# Patient Record
Sex: Female | Born: 1990 | Race: White | Hispanic: No | Smoking: Never smoker
Health system: Southern US, Community
[De-identification: ages and names within clinical notes are randomized; demographics above are authoritative.]

## PROBLEM LIST (undated history)

## (undated) ENCOUNTER — Inpatient Hospital Stay: Payer: Self-pay

## (undated) DIAGNOSIS — I10 Essential (primary) hypertension: Secondary | ICD-10-CM

## (undated) DIAGNOSIS — O139 Gestational [pregnancy-induced] hypertension without significant proteinuria, unspecified trimester: Secondary | ICD-10-CM

## (undated) DIAGNOSIS — F329 Major depressive disorder, single episode, unspecified: Secondary | ICD-10-CM

## (undated) DIAGNOSIS — F32A Depression, unspecified: Secondary | ICD-10-CM

## (undated) DIAGNOSIS — D649 Anemia, unspecified: Secondary | ICD-10-CM

## (undated) HISTORY — DX: Depression, unspecified: F32.A

## (undated) HISTORY — PX: TONSILLECTOMY: SUR1361

## (undated) HISTORY — DX: Anemia, unspecified: D64.9

## (undated) HISTORY — DX: Major depressive disorder, single episode, unspecified: F32.9

---
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
## 2008-12-14 ENCOUNTER — Ambulatory Visit: Payer: Self-pay | Admitting: Otolaryngology

## 2009-08-10 ENCOUNTER — Ambulatory Visit: Payer: Self-pay | Admitting: Oncology

## 2009-08-23 ENCOUNTER — Other Ambulatory Visit: Admission: RE | Admit: 2009-08-23 | Discharge: 2009-08-23 | Payer: Self-pay | Admitting: Oncology

## 2009-08-23 LAB — MORPHOLOGY - CHCC SATELLITE: PLT EST ~~LOC~~: INCREASED

## 2009-08-23 LAB — CBC WITH DIFFERENTIAL (CANCER CENTER ONLY)
BASO#: 0.2 10*3/uL (ref 0.0–0.2)
EOS%: 4.6 % (ref 0.0–7.0)
Eosinophils Absolute: 0.8 10*3/uL — ABNORMAL HIGH (ref 0.0–0.5)
HCT: 35.8 % (ref 34.8–46.6)
HGB: 12.1 g/dL (ref 11.6–15.9)
MCH: 27 pg (ref 26.0–34.0)
MCHC: 33.7 g/dL (ref 32.0–36.0)
MCV: 80 fL — ABNORMAL LOW (ref 81–101)
MONO%: 5.6 % (ref 0.0–13.0)
NEUT#: 10.1 10*3/uL — ABNORMAL HIGH (ref 1.5–6.5)
NEUT%: 59.9 % (ref 39.6–80.0)
RBC: 4.46 10*6/uL (ref 3.70–5.32)

## 2009-08-23 LAB — CMP (CANCER CENTER ONLY)
AST: 13 U/L (ref 11–38)
Albumin: 3.5 g/dL (ref 3.3–5.5)
Alkaline Phosphatase: 65 U/L (ref 26–84)
BUN, Bld: 10 mg/dL (ref 7–22)
Calcium: 9.1 mg/dL (ref 8.0–10.3)
Creat: 0.8 mg/dl (ref 0.6–1.2)
Glucose, Bld: 96 mg/dL (ref 73–118)
Potassium: 4.3 mEq/L (ref 3.3–4.7)

## 2009-08-31 LAB — JAK2 GENOTYPR: JAK2 GenotypR: NOT DETECTED

## 2009-08-31 LAB — IRON AND TIBC
%SAT: 10 % — ABNORMAL LOW (ref 20–55)
TIBC: 383 ug/dL (ref 250–470)
UIBC: 346 ug/dL

## 2009-08-31 LAB — FERRITIN: Ferritin: 27 ng/mL (ref 10–291)

## 2009-08-31 LAB — RETICULOCYTES (CHCC): ABS Retic: 60.2 10*3/uL (ref 19.0–186.0)

## 2009-09-15 ENCOUNTER — Ambulatory Visit: Payer: Self-pay | Admitting: Oncology

## 2009-09-22 LAB — CBC WITH DIFFERENTIAL (CANCER CENTER ONLY)
BASO#: 0.2 10*3/uL (ref 0.0–0.2)
Eosinophils Absolute: 0.8 10*3/uL — ABNORMAL HIGH (ref 0.0–0.5)
HCT: 36.5 % (ref 34.8–46.6)
HGB: 12.2 g/dL (ref 11.6–15.9)
LYMPH#: 5.4 10*3/uL — ABNORMAL HIGH (ref 0.9–3.3)
MCHC: 33.5 g/dL (ref 32.0–36.0)
MONO#: 0.8 10*3/uL (ref 0.1–0.9)
NEUT#: 9.5 10*3/uL — ABNORMAL HIGH (ref 1.5–6.5)
NEUT%: 57.2 % (ref 39.6–80.0)
RBC: 4.64 10*6/uL (ref 3.70–5.32)
WBC: 16.7 10*3/uL — ABNORMAL HIGH (ref 3.9–10.0)

## 2011-12-31 ENCOUNTER — Observation Stay: Payer: Self-pay

## 2011-12-31 LAB — PIH PROFILE
Anion Gap: 8 (ref 7–16)
BUN: 7 mg/dL (ref 7–18)
Chloride: 109 mmol/L — ABNORMAL HIGH (ref 98–107)
EGFR (African American): >60
EGFR (Non-African Amer.): >60
Glucose: 122 mg/dL — ABNORMAL HIGH (ref 65–99)
HGB: 10.4 g/dL — ABNORMAL LOW (ref 12.0–16.0)
Osmolality: 275 (ref 275–301)
Platelet: 338 10*3/uL (ref 150–440)
RBC: 4.17 10*6/uL (ref 3.80–5.20)
SGOT(AST): 11 U/L — ABNORMAL LOW (ref 15–37)
Uric Acid: 5 mg/dL (ref 2.6–6.0)
WBC: 15.4 10*3/uL — ABNORMAL HIGH (ref 3.6–11.0)

## 2012-01-01 LAB — CREATININE, URINE, 24 HOUR
Collection Hours: 24 hours
Creatinine, 24 Hr Urine: 1045 mg/24hr — ABNORMAL HIGH (ref 600–1000)

## 2012-01-01 LAB — PROTEIN, URINE, 24 HOUR
Collection Hours: 24 hours
Protein, Urine: 16 mg/dL (ref 0–12)
Total Volume: 1300 mL

## 2012-01-14 ENCOUNTER — Inpatient Hospital Stay: Payer: Self-pay | Admitting: Obstetrics and Gynecology

## 2012-01-14 LAB — PIH PROFILE
Calcium, Total: 8.7 mg/dL (ref 8.5–10.1)
Chloride: 108 mmol/L — ABNORMAL HIGH (ref 98–107)
Co2: 23 mmol/L (ref 21–32)
Creatinine: 0.51 mg/dL — ABNORMAL LOW (ref 0.60–1.30)
EGFR (African American): >60
Glucose: 86 mg/dL (ref 65–99)
MCHC: 31.9 g/dL — ABNORMAL LOW (ref 32.0–36.0)
Platelet: 362 10*3/uL (ref 150–440)
Potassium: 4.2 mmol/L (ref 3.5–5.1)
RDW: 16.1 % — ABNORMAL HIGH (ref 11.5–14.5)
SGOT(AST): 9 U/L — ABNORMAL LOW (ref 15–37)
Sodium: 137 mmol/L (ref 136–145)
Uric Acid: 5 mg/dL (ref 2.6–6.0)
WBC: 16.9 10*3/uL — ABNORMAL HIGH (ref 3.6–11.0)

## 2012-01-14 LAB — PROTEIN / CREATININE RATIO, URINE
Creatinine, Urine: 122.3 mg/dL (ref 30.0–125.0)
Protein, Random Urine: 43 mg/dL — ABNORMAL HIGH (ref 0–12)

## 2012-01-15 LAB — CBC WITH DIFFERENTIAL/PLATELET
Basophil #: 0.1 10*3/uL (ref 0.0–0.1)
Eosinophil %: 0.5 %
HGB: 10.7 g/dL — ABNORMAL LOW (ref 12.0–16.0)
Lymphocyte #: 2.9 10*3/uL (ref 1.0–3.6)
Lymphocyte %: 16.2 %
MCH: 24.7 pg — ABNORMAL LOW (ref 26.0–34.0)
MCV: 79 fL — ABNORMAL LOW (ref 80–100)
Monocyte #: 0.8 x10 3/mm (ref 0.2–0.9)
Monocyte %: 4.6 %
Neutrophil %: 78.2 %
Platelet: 376 10*3/uL (ref 150–440)
RDW: 16.2 % — ABNORMAL HIGH (ref 11.5–14.5)
WBC: 17.8 10*3/uL — ABNORMAL HIGH (ref 3.6–11.0)

## 2012-01-15 LAB — PROTEIN, URINE, 24 HOUR
Protein, 24 Hour Urine: 360 mg/24HR — ABNORMAL HIGH (ref 30–149)
Protein, Urine: 12 mg/dL (ref 0–12)

## 2012-01-16 DIAGNOSIS — O1490 Unspecified pre-eclampsia, unspecified trimester: Secondary | ICD-10-CM

## 2012-01-17 LAB — HEMATOCRIT: HCT: 30.2 % — ABNORMAL LOW (ref 35.0–47.0)

## 2014-06-14 NOTE — H&P (Signed)
L&D Evaluation:  History:   HPI 24 year old G1P0 presents to L&D at 35 weeks 3 days from [facility]. She had a few elevated BP's in the office (130-140/80's) and had increased swelling. EDD 02/01/12, [facility] not notable for any significant events so far.    Presents with PIH eval    Patient's Medical History No Chronic Illness    Patient's Surgical History none    Medications Pre Natal Vitamins    Allergies Codeine    Social History none    Family History Non-Contributory   ROS:   ROS see HPI   Exam:   Vital Signs mainly 130-140/70-80's    Urine Protein negative dipstick, P/C ratio 255    General no apparent distress    Mental Status clear    Chest clear    Abdomen gravid, non-tender    Back no CVAT    Edema 1+    Reflexes 2+    Clonus negative    Mebranes Intact    FHT normal rate with no decels    Ucx irritability noted    Skin dry   Impression:   Impression evaluation for PIH, reactive NST, 35 weeks 3 days, PIH   Plan:   Plan EFM/NST, PIH panel, discharge    Comments PIH labs WNL P/C ratio 255 Will dc to home to complete 24 hour urine, pt to drop off tomorrow to hosp Has appt next Tues at office Note given for pt to be out of work the next few days, will return to work on Dec 2 (Monday)    Follow Up Appointment already scheduled. in 1 week   Electronic Signatures: Shella Maximutnam, Chaniah Cisse (CNM)  (Signed [id number]-Nov-13 12:03)  Authored: L&D Evaluation   Last Updated: 26-Nov-13 12:03 by Shella MaximPutnam, Castella Lerner (CNM)

## 2014-06-14 NOTE — H&P (Signed)
L&D Evaluation:  History Expanded:   HPI 24 yo G1P0 at 6637 3/7 weeks w h/o Pregnancy Induced Hypertension, still working and noted increased BP today in office.  Mild headache today relieved w Tylenol.  Denies blurry vision, epigastric pain, worsening edema, SOB, CP, or symptoms of labor.   BP in office was 150/100.  Prior labs and eval for Pregnancy Induced Hypertension was 11/26-27, with 24 hour urine protein 200.   [facility], compl by obesity.    Gravida 1    Term 0    Patient's Medical History Obesity    Patient's Surgical History none    Medications Pre Natal Vitamins    Allergies NKDA    Social History none    Family History Non-Contributory   ROS:   ROS All systems were reviewed.  HEENT, CNS, GI, GU, Respiratory, CV, Renal and Musculoskeletal systems were found to be normal.   Exam:   Vital Signs BP 153/105 initially, then 120/60s mostly, P 80.    Urine Protein trace    General no apparent distress    Mental Status clear    Chest clear    Heart normal sinus rhythm, no murmur/gallop/rubs    Abdomen gravid, non-tender    Estimated Fetal Weight Average for gestational age    Back no CVAT    Edema 1+    Reflexes 1+    Pelvic (2/80 per office exam Dr Bonney AidStaebler)    FHT normal rate with no decels    Ucx absent    Skin dry    Other See Pregnancy Induced Hypertension labs   Impression:   Impression evaluation for PIH   Plan:   Plan EFM/NST    Comments 24 hour urine for protein.  Option sof management at 37 3/7 weeks discussed, with preference for exp management and rest as this seems to help w BP control.  No more work.  Prefer to wait til 39 weeks.  If urine results elevated more, option of Induction of labor reasonable. Patient is counseled at length about the risks and consequences of Pregnancy Induced Hypertension, especially as it pertains to the development of preclampsia.  Risks of preclampsia include maternal and fetal  complications, usually necessitating active delivery planning.  Physical exam findings, fetal heart rate monitoring, and laboratory findings are utilized together to determine the presence and severity of any gestational hypertension disorder.  Further management will be based on these findings.  Continued bed rest and frequent follow-up for blood pressure checks and symptom evaluation is of utmost importance for the remainder of pregnancy.   Electronic Signatures: Letitia LibraHarris, Pippa Hanif Paul (MD)  (Signed 10-Dec-13 14:22)  Authored: L&D Evaluation   Last Updated: 10-Dec-13 14:22 by Letitia LibraHarris, Jackqueline Aquilar Paul (MD)

## 2014-08-11 LAB — OB RESULTS CONSOLE RUBELLA ANTIBODY, IGM: RUBELLA: NON-IMMUNE/NOT IMMUNE

## 2014-08-11 LAB — OB RESULTS CONSOLE GC/CHLAMYDIA
CHLAMYDIA, DNA PROBE: NEGATIVE
Gonorrhea: NEGATIVE

## 2015-01-16 LAB — OB RESULTS CONSOLE VARICELLA ZOSTER ANTIBODY, IGG: Varicella: IMMUNE

## 2015-02-05 NOTE — L&D Delivery Note (Signed)
VAGINAL DELIVERY NOTE:  Date of Delivery: 04/05/2015 Primary OB: [facility]  Gestational Age/EDD: [redacted]w[redacted]d 04/10/2015, by Other Basis Antepartum complications: Obesity, Hypertension Attending Physician: Annamarie Major, MD, FACOG Delivery Type: spontaneous vaginal delivery  Anesthesia: epidural Laceration: none Episiotomy: none Placenta: spontaneous Intrapartum complications: None Estimated Blood Loss: 100 mL GBS: Pos  Baby: Liveborn female, Apgars 8/9, weight 7 #, 9 oz

## 2015-02-21 ENCOUNTER — Observation Stay
Admission: EM | Admit: 2015-02-21 | Discharge: 2015-02-21 | Disposition: A | Discharge status: Home / Self Care | Payer: Medicaid Other | Attending: Obstetrics & Gynecology | Admitting: Obstetrics & Gynecology

## 2015-02-21 ENCOUNTER — Encounter: Payer: Self-pay | Admitting: *Deleted

## 2015-02-21 ENCOUNTER — Observation Stay: Payer: Medicaid Other

## 2015-02-21 DIAGNOSIS — O212 Late vomiting of pregnancy: Secondary | ICD-10-CM | POA: Diagnosis not present

## 2015-02-21 DIAGNOSIS — O2303 Infections of kidney in pregnancy, third trimester: Secondary | ICD-10-CM

## 2015-02-21 DIAGNOSIS — O219 Vomiting of pregnancy, unspecified: Secondary | ICD-10-CM | POA: Diagnosis present

## 2015-02-21 DIAGNOSIS — E860 Dehydration: Secondary | ICD-10-CM | POA: Insufficient documentation

## 2015-02-21 DIAGNOSIS — Z885 Allergy status to narcotic agent status: Secondary | ICD-10-CM | POA: Insufficient documentation

## 2015-02-21 DIAGNOSIS — O479 False labor, unspecified: Secondary | ICD-10-CM | POA: Diagnosis present

## 2015-02-21 DIAGNOSIS — O2343 Unspecified infection of urinary tract in pregnancy, third trimester: Secondary | ICD-10-CM | POA: Diagnosis not present

## 2015-02-21 DIAGNOSIS — Z3A33 33 weeks gestation of pregnancy: Secondary | ICD-10-CM | POA: Diagnosis not present

## 2015-02-21 LAB — URINALYSIS COMPLETE WITH MICROSCOPIC (ARMC ONLY)
BILIRUBIN URINE: NEGATIVE
GLUCOSE, UA: NEGATIVE mg/dL
Hgb urine dipstick: NEGATIVE
Ketones, ur: NEGATIVE mg/dL
NITRITE: NEGATIVE
Protein, ur: 30 mg/dL — AB
SPECIFIC GRAVITY, URINE: 1.016 (ref 1.005–1.030)
TRANS EPITHEL UA: 1
pH: 6 (ref 5.0–8.0)

## 2015-02-21 LAB — COMPREHENSIVE METABOLIC PANEL
ALK PHOS: 108 U/L (ref 38–126)
ALT: 13 U/L — AB (ref 14–54)
AST: 15 U/L (ref 15–41)
Albumin: 2.6 g/dL — ABNORMAL LOW (ref 3.5–5.0)
Anion gap: 10 (ref 5–15)
BUN: 7 mg/dL (ref 6–20)
CALCIUM: 8.7 mg/dL — AB (ref 8.9–10.3)
CHLORIDE: 105 mmol/L (ref 101–111)
CO2: 22 mmol/L (ref 22–32)
CREATININE: 0.7 mg/dL (ref 0.44–1.00)
GFR calc Af Amer: >60 mL/min (ref >60)
GFR calc non Af Amer: >60 mL/min (ref >60)
Glucose, Bld: 99 mg/dL (ref 65–99)
Potassium: 3.6 mmol/L (ref 3.5–5.1)
SODIUM: 137 mmol/L (ref 135–145)
Total Bilirubin: 0.3 mg/dL (ref 0.3–1.2)
Total Protein: 6.4 g/dL — ABNORMAL LOW (ref 6.5–8.1)

## 2015-02-21 LAB — CBC
HCT: 29.9 % — ABNORMAL LOW (ref 35.0–47.0)
HEMOGLOBIN: 9.6 g/dL — AB (ref 12.0–16.0)
MCH: 25 pg — ABNORMAL LOW (ref 26.0–34.0)
MCHC: 32.3 g/dL (ref 32.0–36.0)
MCV: 77.6 fL — AB (ref 80.0–100.0)
PLATELETS: 325 10*3/uL (ref 150–440)
RBC: 3.85 MIL/uL (ref 3.80–5.20)
RDW: 15.6 % — ABNORMAL HIGH (ref 11.5–14.5)
WBC: 21.6 10*3/uL — AB (ref 3.6–11.0)

## 2015-02-21 LAB — PROTEIN / CREATININE RATIO, URINE
CREATININE, URINE: 360 mg/dL
Protein Creatinine Ratio: 0.14 mg/mg{Cre} (ref 0.00–0.15)
TOTAL PROTEIN, URINE: 52 mg/dL

## 2015-02-21 MED ORDER — LACTATED RINGERS IV SOLN
INTRAVENOUS | Status: DC
  Administered 2015-02-21 (×2): via INTRAVENOUS

## 2015-02-21 MED ORDER — MORPHINE SULFATE (PF) 2 MG/ML IV SOLN
1.0000 mg | INTRAVENOUS | Status: DC | PRN
  Administered 2015-02-21: 1 mg via INTRAVENOUS
  Filled 2015-02-21: qty 1

## 2015-02-21 MED ORDER — ACETAMINOPHEN 325 MG PO TABS: 650.0000 mg | ORAL_TABLET | ORAL | Status: DC | PRN

## 2015-02-21 MED ORDER — OXYCODONE-ACETAMINOPHEN 5-325 MG PO TABS
1.0000 | ORAL_TABLET | ORAL | Status: DC | PRN
  Administered 2015-02-21: 1 via ORAL
  Filled 2015-02-21: qty 2

## 2015-02-21 MED ORDER — DEXTROSE 5 % IV SOLN
1.0000 g | INTRAVENOUS | Status: DC
  Administered 2015-02-21: 1 g via INTRAVENOUS
  Filled 2015-02-21: qty 10

## 2015-02-21 MED ORDER — ONDANSETRON HCL 4 MG/2ML IJ SOLN
4.0000 mg | Freq: Four times a day (QID) | INTRAMUSCULAR | Status: DC | PRN
  Administered 2015-02-21: 4 mg via INTRAVENOUS
  Filled 2015-02-21: qty 2

## 2015-02-21 NOTE — Progress Notes (Signed)
[place] c/w milf left hydro and 6mm non-obstructive kidney stone. Pt still has pain. Will treat w one dose of Morphine now.  Rec treat infection and monitor for worsening pain sx's that could be related to stone.  May be incidental finding. Fluids, ABX, analgesics. F/U this week.

## 2015-02-21 NOTE — Progress Notes (Signed)
Benign Gynecology Progress Note  Admission Date: 02/21/2015 Current Date: 02/21/2015  Cheryl Pope is a 25 y.o. G2P0  @ [redacted]w[redacted]d with left side and flank pain, also abd pain, assoc w n/v/d See UA- bllod and leuk and WBC and protein WBC elevated.   History complicated by: Patient Active Problem List   Diagnosis Date Noted  . Nausea and vomiting of pregnancy, antepartum 02/21/2015  . Irregular contractions 02/21/2015   ROS and patient/family/surgical history, located on admission H&P note dated 02/21/2015, have been reviewed, and there are no changes except as noted below  Subjective:  Still has sporadic pains.   Objective:   Filed Vitals:   02/21/15 1350 02/21/15 1508  BP: 137/73 134/63  Pulse: 98 79  Temp: 97.7 F (36.5 C)   TempSrc: Oral   Resp: 18    Temp:  [97.7 F (36.5 C)] 97.7 F (36.5 C) (01/17 1350) Pulse Rate:  [79-98] 79 (01/17 1508) Resp:  [18] 18 (01/17 1350) BP: (134-137)/(63-73) 134/63 mmHg (01/17 1508)     No intake or output data in the 24 hours ending 02/21/15 1633   Current Vital Signs 24h Vital Sign Ranges  T 97.7 F (36.5 C) Temp  Avg: 97.7 F (36.5 C)  Min: 97.7 F (36.5 C)  Max: 97.7 F (36.5 C)  BP 134/63 mmHg BP  Min: 134/63  Max: 137/73  HR 79 Pulse  Avg: 88.5  Min: 79  Max: 98  RR 18 Resp  Avg: 18  Min: 18  Max: 18  SaO2     No Data Recorded           24 Hour I/O Current Shift I/O  Time Ins Outs        Physical exam: General appearance: alert, cooperative and mild distress Abdomen: gravid NT uterus, tender on L side and flank. GU: No gross VB Back: No CVAT Extremities: no redness or tenderness in the calves or thighs, no edema Skin: no lesions SVE- Cl/Th/High Psych: appropriate  Labs:    Recent Labs Lab 02/21/15 1452  NA 137  K 3.6  CL 105  CO2 22  BUN 7  CREATININE 0.70  GLUCOSE 99    Recent Labs Lab 02/21/15 1452  WBC 21.6*  HGB 9.6*  HCT 29.9*  PLT 325     Recent Labs Lab 02/21/15 1452   CALCIUM 8.7*   No results for input(s): INR, APTT in the last 168 hours.     Recent Labs Lab 02/21/15 1452  ALKPHOS 108  BILITOT 0.3  PROT 6.4*  ALT 13*  AST 15     Recent Labs Lab 02/21/15 1452  WBC 21.6*  HGB 9.6*  HCT 29.9*  PLT 325    Recent Labs Lab 02/21/15 1452  NA 137  K 3.6  CL 105  CO2 22  BUN 7  CREATININE 0.70  CALCIUM 8.7*  PROT 6.4*  BILITOT 0.3  ALKPHOS 108  ALT 13*  AST 15  GLUCOSE 99    Assessment & Plan:  Pregnant, 33 weeks, Abdominal Pain, N/V/D, UTI by UA. Possible Gastroenteritis, also possible Pylonephritis  1. IVF 2. Ceftriaxone for pyelo 3. Renal US to evaluate for hydro/abscess/stone 4. Treat GI sx's conservatively 5. FWR.  Periodic FHR monitoring.  No s/sx PTL.

## 2015-02-21 NOTE — Discharge Instructions (Signed)
Pyelonephritis, Adult Pyelonephritis is a kidney infection. The kidneys are the organs that filter a person's blood and move waste out of the bloodstream and into the urine. Urine passes from the kidneys, through the ureters, and into the bladder. There are two main types of pyelonephritis:  Infections that come on quickly without any warning (acute pyelonephritis).  Infections that last for a long period of time (chronic pyelonephritis). In most cases, the infection clears up with treatment and does not cause further problems. More severe infections or chronic infections can sometimes spread to the bloodstream or lead to other problems with the kidneys. CAUSES This condition is usually caused by:  Bacteria traveling from the bladder to the kidney through infected urine. The urine in the bladder can become infected with bacteria from:  Bladder infection (cystitis).  Inflammation of the prostate gland (prostatitis).  Sexual intercourse, in females.  Bacteria traveling from the bloodstream to the kidney. RISK FACTORS This condition is more likely to develop in:  Pregnant women.  Older people.  People who have diabetes.  People who have kidney stones or bladder stones.  People who have other abnormalities of the kidney or ureter.  People who have a catheter placed in the bladder.  People who have cancer.  People who are sexually active.  Women who use spermicides.  People who have had a prior urinary tract infection. SYMPTOMS Symptoms of this condition include:  Frequent urination.  Strong or persistent urge to urinate.  Burning or stinging when urinating.  Abdominal pain.  Back pain.  Pain in the side or flank area.  Fever.  Chills.  Blood in the urine, or dark urine.  Nausea.  Vomiting. DIAGNOSIS This condition may be diagnosed based on:  Medical history and physical exam.  Urine tests.  Blood tests. You may also have imaging tests of the  kidneys, such as an ultrasound or CT scan. TREATMENT Treatment for this condition may depend on the severity of the infection.  If the infection is mild and is found early, you may be treated with antibiotic medicines taken by mouth. You will need to drink fluids to remain hydrated.  If the infection is more severe, you may need to stay in the hospital and receive antibiotics given directly into a vein through an IV tube. You may also need to receive fluids through an IV tube if you are not able to remain hydrated. After your hospital stay, you may need to take oral antibiotics for a period of time. Other treatments may be required, depending on the cause of the infection. HOME CARE INSTRUCTIONS Medicines  Take over-the-counter and prescription medicines only as told by your health care provider.  If you were prescribed an antibiotic medicine, take it as told by your health care provider. Do not stop taking the antibiotic even if you start to feel better. General Instructions  Drink enough fluid to keep your urine clear or pale yellow.  Avoid caffeine, tea, and carbonated beverages. They tend to irritate the bladder.  Urinate often. Avoid holding in urine for long periods of time.  Urinate before and after sex.  After a bowel movement, women should cleanse from front to back. Use each tissue only once.  Keep all follow-up visits as told by your health care provider. This is important. SEEK MEDICAL CARE IF:  Your symptoms do not get better after 2 days of treatment.  Your symptoms get worse.  You have a fever. SEEK IMMEDIATE MEDICAL CARE IF:  You  are unable to take your antibiotics or fluids.  You have shaking chills.  You vomit.  You have severe flank or back pain.  You have extreme weakness or fainting.   This information is not intended to replace advice given to you by your health care provider. Make sure you discuss any questions you have with your health care  provider.   Document Released: 01/21/2005 Document Revised: 10/12/2014 Document Reviewed: 05/16/2014 Elsevier Interactive Patient Education 2016 Else[employer] Inc[employer]  Pyelonephritis, Adult Pyelonephritis is a kidney infection. The kidneys are the organs that filter a person's blood and move waste out of the bloodstream and into the urine. Urine passes from the kidneys, through the ureters, and into the bladder. There are two main types of pyelonephritis:  Infections that come on quickly without any warning (acute pyelonephritis).  Infections that last for a long period of time (chronic pyelonephritis). In most cases, the infection clears up with treatment and does not cause further problems. More severe infections or chronic infections can sometimes spread to the bloodstream or lead to other problems with the kidneys. CAUSES This condition is usually caused by:  Bacteria traveling from the bladder to the kidney through infected urine. The urine in the bladder can become infected with bacteria from:  Bladder infection (cystitis).  Inflammation of the prostate gland (prostatitis).  Sexual intercourse, in females.  Bacteria traveling from the bloodstream to the kidney. RISK FACTORS This condition is more likely to develop in:  Pregnant women.  Older people.  People who have diabetes.  People who have kidney stones or bladder stones.  People who have other abnormalities of the kidney or ureter.  People who have a catheter placed in the bladder.  People who have cancer.  People who are sexually active.  Women who use spermicides.  People who have had a prior urinary tract infection. SYMPTOMS Symptoms of this condition include:  Frequent urination.  Strong or persistent urge to urinate.  Burning or stinging when urinating.  Abdominal pain.  Back pain.  Pain in the side or flank area.  Fever.  Chills.  Blood in the urine, or dark  urine.  Nausea.  Vomiting. DIAGNOSIS This condition may be diagnosed based on:  Medical history and physical exam.  Urine tests.  Blood tests. You may also have imaging tests of the kidneys, such as an ultrasound or CT scan. TREATMENT Treatment for this condition may depend on the severity of the infection.  If the infection is mild and is found early, you may be treated with antibiotic medicines taken by mouth. You will need to drink fluids to remain hydrated.  If the infection is more severe, you may need to stay in the hospital and receive antibiotics given directly into a vein through an IV tube. You may also need to receive fluids through an IV tube if you are not able to remain hydrated. After your hospital stay, you may need to take oral antibiotics for a period of time. Other treatments may be required, depending on the cause of the infection. HOME CARE INSTRUCTIONS Medicines  Take over-the-counter and prescription medicines only as told by your health care provider.  If you were prescribed an antibiotic medicine, take it as told by your health care provider. Do not stop taking the antibiotic even if you start to feel better. General Instructions  Drink enough fluid to keep your urine clear or pale yellow.  Avoid caffeine, tea, and carbonated beverages. They tend to irritate the bladder.  Urinate often. Avoid holding in urine for long periods of time.  Urinate before and after sex.  After a bowel movement, women should cleanse from front to back. Use each tissue only once.  Keep all follow-up visits as told by your health care provider. This is important. SEEK MEDICAL CARE IF:  Your symptoms do not get better after 2 days of treatment.  Your symptoms get worse.  You have a fever. SEEK IMMEDIATE MEDICAL CARE IF:  You are unable to take your antibiotics or fluids.  You have shaking chills.  You vomit.  You have severe flank or back pain.  You have  extreme weakness or fainting.   This information is not intended to replace advice given to you by your health care provider. Make sure you discuss any questions you have with your health care provider.   Document Released: 01/21/2005 Document Revised: 10/12/2014 Document Reviewed: 05/16/2014 Elsevier Interactive Patient Education [employer]   Patient will  Pyelonephritis, Adult Pyelonephritis is a kidney infection. The kidneys are organs that help clean your blood by moving waste out of your blood and into your pee (urine). This infection can happen quickly, or it can last for a long time. In most cases, it clears up with treatment and does not cause other problems. HOME CARE Medicines  Take over-the-counter and prescription medicines only as told by your doctor.  Take your antibiotic medicine as told by your doctor. Do not stop taking the medicine even if you start to feel better. General Instructions  Drink enough fluid to keep your pee clear or pale yellow.  Avoid caffeine, tea, and carbonated drinks.  Pee (urinate) often. Avoid holding in pee for long periods of time.  Pee before and after sex.  After pooping (having a bowel movement), women should wipe from front to back. Use each tissue only once.  Keep all follow-up visits as told by your doctor. This is important. GET HELP IF:  You do not feel better after 2 days.  Your symptoms get worse.  You have a fever. GET HELP RIGHT AWAY IF:  You cannot take your medicine or drink fluids as told.  You have chills and shaking.  You throw up (vomit).  You have very bad pain in your side (flank) or back.  You feel very weak or you pass out (faint).   This information is not intended to replace advice given to you by your health care provider. Make sure you discuss any questions you have with your health care provider.   Document Released: 02/29/2004 Document Revised: 10/12/2014 Document Reviewed:  05/16/2014 Elsevier Interactive Patient Education [employer]

## 2015-02-21 NOTE — H&P (Signed)
Obstetrics Admission History & Physical   Abdominal Pain Nausea   HPI:  25 y.o. G2P0 @ [redacted]w[redacted]d (04/10/2015, by Other Basis). Admitted on 02/21/2015:   Patient Active Problem List   Diagnosis Date Noted  . Nausea and vomiting of pregnancy, antepartum 02/21/2015  . Irregular contractions 02/21/2015     Presents for 1 day h/o n/v/d, fatigue, andominal pain.  Dehydration.  P[facility] care at: at [facility]  PMHx:  Past Medical History  Diagnosis Date  . Medical history non-contributory    PSHx:  Past Surgical History  Procedure Laterality Date  . Tonsillectomy     Medications:  Prescriptions prior to admission  Medication Sig Dispense Refill Last Dose  . Prenatal Vit-Fe Fumarate-FA (PRENATAL MULTIVITAMIN) TABS tablet Take 1 tablet by mouth daily at 12 noon.   Unknown at Unknown time   Allergies: is allergic to codeine. OBHx:  OB History  Gravida Para Term Preterm AB SAB TAB Ectopic Multiple Living  2             # Outcome Date GA Lbr Len/2nd Weight Sex Delivery Anes PTL Lv  2 Current           1 Gravida 01/16/12    M Vag-Spont        Complications: Preeclampsia     WUJ:[id number] except as detailed in HPI. Soc Hx: Pregnancy welcomed  Objective:   Filed Vitals:   02/21/15 1350  BP: 137/73  Pulse: 98  Temp: 97.7 F (36.5 C)  Resp: 18   General: Well nourished, well developed female in no acute distress.  Skin: Warm and dry.  Cardiovascular:Regular rate and rhythm. Respiratory: Clear to auscultation bilateral. Normal respiratory effort Abdomen: mild Neuro/Psych: Normal mood and affect.   EFM:FHR: 150 bpm, variability: moderate,  accelerations:  Present,  decelerations:  Absent Toco: None  Assessment & Plan:   25 y.o. G2P0 @ [redacted]w[redacted]d, Admitted on 02/21/2015: nausea and vomiting, diarrhea, associated abd pain---possible GASTROENTERITIS.     Plan to monitor and check labs and provide IVF.  FWR.

## 2015-02-21 NOTE — Final Progress Note (Signed)
Physician Final Progress Note  Patient ID: Cheryl Pope MRN: [id number] DOB/AGE: 08-10-1990 25 y.o.  Admit date: 02/21/2015 Admitting provider: Nadara Mustard, MD Discharge date: 02/21/2015   Admission Diagnoses: Pain  Discharge Diagnoses:  Principal Problem:   Nausea and vomiting of pregnancy, antepartum Active Problems:   Irregular contractions  UTI  Consults: None  Significant Findings/ Diagnostic Studies: labs: UTI and radiology: US kidney w mild left hydro and 6mm stone  Procedures: A NST procedure was performed with FHR monitoring and a normal baseline established, appropriate time of 20-40 minutes of evaluation, and accels >2 seen w 15x15 characteristics.  Results show a REACTIVE NST.   Discharge Condition: good  Disposition: home  Diet: Regular diet  Discharge Activity: Activity as tolerated     Medication List    TAKE these medications        prenatal multivitamin Tabs tablet  Take 1 tablet by mouth daily at 12 noon.           Follow-up Information    Follow up with [name], MD In 2 days.   Specialty:  Obstetrics and Gynecology   Why:  folow up   Contact information:   [address] [place] [address] [phone]       Total time spent taking care of this patient: 30 minutes  Signed: [name] 02/21/2015, 7:27 PM

## 2015-02-21 NOTE — OB Triage Note (Signed)
Patient states she vomited at 1130 and since then has had abd pain that tightens and pressure in pelvis that come bout every 10 min with a sharp stabbing pain in her left lower back, pt does complain of feeling the need to go pee all the time put can't. Patient has vomited once since initial emesis at 1130.denies any LOF, bleeding or any other concerns.

## 2015-02-21 NOTE — Progress Notes (Signed)
Written Rx's given to patient.  Discharge instructions given and  reviewed with patient, denies any further questions.

## 2015-03-10 ENCOUNTER — Encounter: Payer: Self-pay | Admitting: *Deleted

## 2015-03-10 ENCOUNTER — Observation Stay
Admission: EM | Admit: 2015-03-10 | Discharge: 2015-03-10 | Disposition: A | Discharge status: Home / Self Care | Payer: Medicaid Other | Attending: Obstetrics and Gynecology | Admitting: Obstetrics and Gynecology

## 2015-03-10 DIAGNOSIS — O113 Pre-existing hypertension with pre-eclampsia, third trimester: Principal | ICD-10-CM | POA: Insufficient documentation

## 2015-03-10 DIAGNOSIS — O10913 Unspecified pre-existing hypertension complicating pregnancy, third trimester: Secondary | ICD-10-CM | POA: Diagnosis not present

## 2015-03-10 DIAGNOSIS — Z3A35 35 weeks gestation of pregnancy: Secondary | ICD-10-CM | POA: Insufficient documentation

## 2015-03-10 HISTORY — DX: Gestational (pregnancy-induced) hypertension without significant proteinuria, unspecified trimester: O13.9

## 2015-03-10 HISTORY — DX: Essential (primary) hypertension: I10

## 2015-03-10 LAB — COMPREHENSIVE METABOLIC PANEL
ALBUMIN: 2.5 g/dL — AB (ref 3.5–5.0)
ALT: 15 U/L (ref 14–54)
AST: 11 U/L — AB (ref 15–41)
Alkaline Phosphatase: 124 U/L (ref 38–126)
Anion gap: 8 (ref 5–15)
BUN: 7 mg/dL (ref 6–20)
CHLORIDE: 105 mmol/L (ref 101–111)
CO2: 22 mmol/L (ref 22–32)
Calcium: 8.2 mg/dL — ABNORMAL LOW (ref 8.9–10.3)
Creatinine, Ser: 0.57 mg/dL (ref 0.44–1.00)
GFR calc Af Amer: >60 mL/min (ref >60)
GFR calc non Af Amer: >60 mL/min (ref >60)
GLUCOSE: 83 mg/dL (ref 65–99)
POTASSIUM: 3.9 mmol/L (ref 3.5–5.1)
Sodium: 135 mmol/L (ref 135–145)
Total Bilirubin: 0.1 mg/dL — ABNORMAL LOW (ref 0.3–1.2)
Total Protein: 6.2 g/dL — ABNORMAL LOW (ref 6.5–8.1)

## 2015-03-10 LAB — CBC
HEMATOCRIT: 30.3 % — AB (ref 35.0–47.0)
Hemoglobin: 9.8 g/dL — ABNORMAL LOW (ref 12.0–16.0)
MCH: 25 pg — ABNORMAL LOW (ref 26.0–34.0)
MCHC: 32.2 g/dL (ref 32.0–36.0)
MCV: 77.6 fL — ABNORMAL LOW (ref 80.0–100.0)
PLATELETS: 344 10*3/uL (ref 150–440)
RBC: 3.9 MIL/uL (ref 3.80–5.20)
RDW: 15.8 % — AB (ref 11.5–14.5)
WBC: 17.2 10*3/uL — AB (ref 3.6–11.0)

## 2015-03-10 LAB — TYPE AND SCREEN
ABO/RH(D): A POS
ANTIBODY SCREEN: NEGATIVE

## 2015-03-10 LAB — URINE DRUG SCREEN, QUALITATIVE (ARMC ONLY)
Amphetamines, Ur Screen: NOT DETECTED
Barbiturates, Ur Screen: NOT DETECTED
Benzodiazepine, Ur Scrn: NOT DETECTED
CANNABINOID 50 NG, UR ~~LOC~~: NOT DETECTED
COCAINE METABOLITE, UR ~~LOC~~: NOT DETECTED
MDMA (ECSTASY) UR SCREEN: NOT DETECTED
Methadone Scn, Ur: NOT DETECTED
OPIATE, UR SCREEN: NOT DETECTED
PHENCYCLIDINE (PCP) UR S: NOT DETECTED
Tricyclic, Ur Screen: NOT DETECTED

## 2015-03-10 LAB — PROTEIN / CREATININE RATIO, URINE
Creatinine, Urine: 137 mg/dL
Protein Creatinine Ratio: 0.15 mg/mg{Cre} (ref 0.00–0.15)
Total Protein, Urine: 21 mg/dL

## 2015-03-10 LAB — ABO/RH: ABO/RH(D): A POS

## 2015-03-10 NOTE — OB Triage Note (Signed)
Sent from [facility] for pre-eclampsia evaluation.

## 2015-03-10 NOTE — Final Progress Note (Signed)
Physician Final Progress Note  Patient ID: Cheryl Pope MRN: [id number] DOB/AGE: [birth year] 25 y.o.  Admit date: 03/10/2015 Admitting provider: Conard Novak, MD Discharge date: 03/10/2015   Admission Diagnoses: Elevated blood pressure/ Proteinuria, Chronic hypertension, IUP at 35.4 weeks  Discharge Diagnoses:  Chronic hypertension No evidence of preeclampsia  Consults: None  Significant Findings/ Diagnostic Studies: 25 year old G2 P1001 with EDC=04/10/2015 by a 7wk1day ultrasound presented from office 35.4 weeks for a blood pressure evaluation. Her blood pressures in office were 140/82 and 150/90 and there was +2 protein in her urine. She has not been feeling well, somewhat tired and had been having scintillating scotomata. Her baby hasn't been moving quite as much as usual. Prenatal care also remarkable for obesity (BMI46), mild anemia, and a hx of GHTN with her previous pregnancy. Exam: General : in acute distress Patient Vitals for the past 24 hrs:  BP Pulse  03/10/15 1555 133/68 mmHg 89  03/10/15 1547 137/73 mmHg 92  03/10/15 1525 133/79 mmHg 94  03/10/15 1511 126/61 mmHg 87  03/10/15 1455 (!) 147/65 mmHg 88  03/10/15 1440 139/61 mmHg 88  03/10/15 1425 136/77 mmHg 95  03/10/15 1410 122/82 mmHg (!) 146  03/10/15 1355 (!) 129/58 mmHg 92  03/10/15 1339 118/63 mmHg 90  03/10/15 1325 121/66 mmHg 93  03/10/15 1309 118/61 mmHg 95  03/10/15 1255 (!) 119/54 mmHg (!) 101  03/10/15 1240 123/62 mmHg (!) 110   Results for orders placed or performed during the hospital encounter of 03/10/15 (from the past 24 hour(s))  Protein / creatinine ratio, urine     Status: None   Collection Time: 03/10/15  1:36 PM  Result Value Ref Range   Creatinine, Urine 137 mg/dL   Total Protein, Urine 21 mg/dL   Protein Creatinine Ratio 0.15 0.00 - 0.15 mg/mg[Cre]  Urine Drug Screen, Qualitative ([facility] only)     Status: None   Collection Time: 03/10/15  1:36 PM  Result Value Ref Range   Tricyclic, Ur Screen NONE DETECTED NONE DETECTED   Amphetamines, Ur Screen NONE DETECTED NONE DETECTED   MDMA (Ecstasy)Ur Screen NONE DETECTED NONE DETECTED   Cocaine Metabolite,Ur [place] NONE DETECTED NONE DETECTED   Opiate, Ur Screen NONE DETECTED NONE DETECTED   Phencyclidine (PCP) Ur S NONE DETECTED NONE DETECTED   Cannabinoid 50 Ng, Ur [place] NONE DETECTED NONE DETECTED   Barbiturates, Ur Screen NONE DETECTED NONE DETECTED   Benzodiazepine, Ur Scrn NONE DETECTED NONE DETECTED   Methadone Scn, Ur NONE DETECTED NONE DETECTED  CBC     Status: Abnormal   Collection Time: 03/10/15  1:39 PM  Result Value Ref Range   WBC 17.2 (H) 3.6 - 11.0 K/uL   RBC 3.90 3.80 - 5.20 MIL/uL   Hemoglobin 9.8 (L) 12.0 - 16.0 g/dL   HCT 78.2 (L) 95.6 - 21.3 %   MCV 77.6 (L) 80.0 - 100.0 fL   MCH 25.0 (L) 26.0 - 34.0 pg   MCHC 32.2 32.0 - 36.0 g/dL   RDW 08.6 (H) 57.8 - 46.9 %   Platelets 344 150 - 440 K/uL  Comprehensive metabolic panel     Status: Abnormal   Collection Time: 03/10/15  1:39 PM  Result Value Ref Range   Sodium 135 135 - 145 mmol/L   Potassium 3.9 3.5 - 5.1 mmol/L   Chloride 105 101 - 111 mmol/L   CO2 22 22 - 32 mmol/L   Glucose, Bld 83 65 - 99 mg/dL   BUN 7  6 - 20 mg/dL   Creatinine, Ser 4.09 0.44 - 1.00 mg/dL   Calcium 8.2 (L) 8.9 - 10.3 mg/dL   Total Protein 6.2 (L) 6.5 - 8.1 g/dL   Albumin 2.5 (L) 3.5 - 5.0 g/dL   AST 11 (L) 15 - 41 U/L   ALT 15 14 - 54 U/L   Alkaline Phosphatase 124 38 - 126 U/L   Total Bilirubin 0.1 (L) 0.3 - 1.2 mg/dL   GFR calc non Af Amer >60 >60 mL/min   GFR calc Af Amer >60 >60 mL/min   Anion gap 8 5 - 15  Type and screen [facility]     Status: None   Collection Time: 03/10/15  1:40 PM  Result Value Ref Range   ABO/RH(D) A POS    Antibody Screen NEG    Sample Expiration 03/13/2015     FHR: NST-reactive with baseline 135 and accelerations to 170s, moderate variability, no decelerations. Baby very active Toco: mild uterine  irritability with occasional contraction A: Normotensive with an occasional mild range blood pressure No evidence of preeclaampsia Cat 1 tracing P: Discharge home with preeclampsia precautions  Discharge Condition: stable  Disposition: 01-Home or Self Care  Diet: Regular diet  Discharge Activity: Activity as tolerated     Medication List    ASK your doctor about these medications        prenatal multivitamin Tabs tablet  Take 1 tablet by mouth daily at 12 noon.      Follow up at [facility] on Feb 7 for NST and AFI   Total time spent taking care of this patient: 20 minutes  Signed: Farrel Conners 03/10/2015, 4:33 PM

## 2015-03-10 NOTE — Progress Notes (Deleted)
Please excuse Cheryl Pope from work as he was present for the birth of his son today and will be assisting his wife with her care and the care of their son through tomorrow 2/4/ 2017.   Farrel Conners, CNM

## 2015-03-10 NOTE — Plan of Care (Signed)
Pt seen by coleen g. Strip reviewed.  Reactive . Labs reviewed by coleen and order received  To d/c pt home. Pt d/c with d/c instructions. To include signs/symptoms of  preeclampsia

## 2015-03-14 LAB — OB RESULTS CONSOLE GBS: STREP GROUP B AG: POSITIVE

## 2015-04-04 ENCOUNTER — Inpatient Hospital Stay
Admission: EM | Admit: 2015-04-04 | Discharge: 2015-04-07 | DRG: 767 | Disposition: A | Discharge status: Home / Self Care | Payer: Medicaid Other | Attending: Certified Nurse Midwife | Admitting: Certified Nurse Midwife

## 2015-04-04 DIAGNOSIS — E669 Obesity, unspecified: Secondary | ICD-10-CM | POA: Diagnosis present

## 2015-04-04 DIAGNOSIS — O1092 Unspecified pre-existing hypertension complicating childbirth: Secondary | ICD-10-CM | POA: Diagnosis present

## 2015-04-04 DIAGNOSIS — O10913 Unspecified pre-existing hypertension complicating pregnancy, third trimester: Secondary | ICD-10-CM | POA: Diagnosis present

## 2015-04-04 DIAGNOSIS — Z3A39 39 weeks gestation of pregnancy: Secondary | ICD-10-CM | POA: Diagnosis not present

## 2015-04-04 DIAGNOSIS — O169 Unspecified maternal hypertension, unspecified trimester: Secondary | ICD-10-CM | POA: Diagnosis present

## 2015-04-04 DIAGNOSIS — Z79899 Other long term (current) drug therapy: Secondary | ICD-10-CM

## 2015-04-04 DIAGNOSIS — O99824 Streptococcus B carrier state complicating childbirth: Secondary | ICD-10-CM | POA: Diagnosis present

## 2015-04-04 DIAGNOSIS — O99214 Obesity complicating childbirth: Secondary | ICD-10-CM | POA: Diagnosis present

## 2015-04-04 DIAGNOSIS — Z302 Encounter for sterilization: Secondary | ICD-10-CM | POA: Diagnosis not present

## 2015-04-04 DIAGNOSIS — Z23 Encounter for immunization: Secondary | ICD-10-CM

## 2015-04-04 DIAGNOSIS — Z6837 Body mass index (BMI) 37.0-37.9, adult: Secondary | ICD-10-CM

## 2015-04-04 LAB — COMPREHENSIVE METABOLIC PANEL
ALBUMIN: 2.3 g/dL — AB (ref 3.5–5.0)
ALT: 11 U/L — ABNORMAL LOW (ref 14–54)
ANION GAP: 9 (ref 5–15)
AST: 15 U/L (ref 15–41)
Alkaline Phosphatase: 149 U/L — ABNORMAL HIGH (ref 38–126)
BUN: 9 mg/dL (ref 6–20)
CO2: 21 mmol/L — AB (ref 22–32)
Calcium: 8.4 mg/dL — ABNORMAL LOW (ref 8.9–10.3)
Chloride: 106 mmol/L (ref 101–111)
Creatinine, Ser: 0.79 mg/dL (ref 0.44–1.00)
GFR calc Af Amer: >60 mL/min (ref >60)
GFR calc non Af Amer: >60 mL/min (ref >60)
GLUCOSE: 94 mg/dL (ref 65–99)
POTASSIUM: 3.7 mmol/L (ref 3.5–5.1)
SODIUM: 136 mmol/L (ref 135–145)
Total Bilirubin: 0.3 mg/dL (ref 0.3–1.2)
Total Protein: 6.1 g/dL — ABNORMAL LOW (ref 6.5–8.1)

## 2015-04-04 LAB — CBC
HCT: 29.7 % — ABNORMAL LOW (ref 35.0–47.0)
HEMOGLOBIN: 9.6 g/dL — AB (ref 12.0–16.0)
MCH: 24.5 pg — ABNORMAL LOW (ref 26.0–34.0)
MCHC: 32.3 g/dL (ref 32.0–36.0)
MCV: 76 fL — ABNORMAL LOW (ref 80.0–100.0)
Platelets: 336 10*3/uL (ref 150–440)
RBC: 3.91 MIL/uL (ref 3.80–5.20)
RDW: 16 % — ABNORMAL HIGH (ref 11.5–14.5)
WBC: 15.9 10*3/uL — ABNORMAL HIGH (ref 3.6–11.0)

## 2015-04-04 LAB — TYPE AND SCREEN
ABO/RH(D): A POS
ANTIBODY SCREEN: NEGATIVE

## 2015-04-04 LAB — PROTEIN / CREATININE RATIO, URINE
Creatinine, Urine: 83 mg/dL
PROTEIN CREATININE RATIO: 0.11 mg/mg{creat} (ref 0.00–0.15)
Total Protein, Urine: 9 mg/dL

## 2015-04-04 MED ORDER — BUTORPHANOL TARTRATE 1 MG/ML IJ SOLN
1.0000 mg | INTRAMUSCULAR | Status: DC | PRN
  Administered 2015-04-05: 2 mg via INTRAVENOUS
  Filled 2015-04-04: qty 2

## 2015-04-04 MED ORDER — OXYTOCIN BOLUS FROM INFUSION
500.0000 mL | INTRAVENOUS | Status: DC
  Administered 2015-04-05: 500 mL via INTRAVENOUS

## 2015-04-04 MED ORDER — LACTATED RINGERS IV SOLN: 500.0000 mL | INTRAVENOUS | Status: DC | PRN

## 2015-04-04 MED ORDER — DINOPROSTONE 10 MG VA INST
10.0000 mg | VAGINAL_INSERT | Freq: Once | VAGINAL | Status: AC
  Administered 2015-04-04: 10 mg via VAGINAL
  Filled 2015-04-04: qty 1

## 2015-04-04 MED ORDER — ACETAMINOPHEN 325 MG PO TABS: 650.0000 mg | ORAL_TABLET | ORAL | Status: DC | PRN

## 2015-04-04 MED ORDER — TERBUTALINE SULFATE 1 MG/ML IJ SOLN: 0.2500 mg | Freq: Once | INTRAMUSCULAR | Status: DC | PRN

## 2015-04-04 MED ORDER — PENICILLIN G POTASSIUM 5000000 UNITS IJ SOLR
5.0000 10*6.[IU] | Freq: Once | INTRAVENOUS | Status: AC
  Administered 2015-04-05: 5 10*6.[IU] via INTRAVENOUS
  Filled 2015-04-04: qty 5

## 2015-04-04 MED ORDER — OXYTOCIN 40 UNITS IN LACTATED RINGERS INFUSION - SIMPLE MED
2.5000 [IU]/h | INTRAVENOUS | Status: DC
  Administered 2015-04-05: 2.5 [IU]/h via INTRAVENOUS

## 2015-04-04 MED ORDER — CITRIC ACID-SODIUM CITRATE 334-500 MG/5ML PO SOLN: 30.0000 mL | ORAL | Status: DC | PRN

## 2015-04-04 MED ORDER — LACTATED RINGERS IV SOLN
INTRAVENOUS | Status: DC
  Administered 2015-04-05: 05:00:00 via INTRAVENOUS

## 2015-04-04 MED ORDER — ONDANSETRON HCL 4 MG/2ML IJ SOLN: 4.0000 mg | Freq: Four times a day (QID) | INTRAMUSCULAR | Status: DC | PRN

## 2015-04-04 MED ORDER — LIDOCAINE HCL (PF) 1 % IJ SOLN
30.0000 mL | INTRAMUSCULAR | Status: DC | PRN
  Administered 2015-04-05: 10 mL via SUBCUTANEOUS

## 2015-04-04 MED ORDER — PENICILLIN G POTASSIUM 5000000 UNITS IJ SOLR
2.5000 10*6.[IU] | INTRAVENOUS | Status: DC
  Administered 2015-04-05 (×3): 2.5 10*6.[IU] via INTRAVENOUS
  Filled 2015-04-04 (×10): qty 2.5

## 2015-04-04 NOTE — H&P (Addendum)
Paper H&P 03/28/15 reviewed  25 year old G2P1001 at [redacted]w[redacted]d with chronic hypertension no medications presenting for induction of labor.  Weight gain this pregnancy 6lbs, 28 week 1-hr 139, pelvis tested to 6lbs 13oz. Growth scan at 34 weeks 5lbs 14oz (1610R) c/w 66.2%ile  Last P/C ratio on 03/10/15 /g creatinine.  Patient does desire post partum BTL - proceed with cervidil IOL - PCN for GBS ppx  - Preeclampsia labs drawn

## 2015-04-05 ENCOUNTER — Inpatient Hospital Stay: Payer: Medicaid Other | Admitting: Registered Nurse

## 2015-04-05 MED ORDER — BUPIVACAINE HCL (PF) 0.25 % IJ SOLN
INTRAMUSCULAR | Status: DC | PRN
  Administered 2015-04-05 (×2): 5 mL via EPIDURAL

## 2015-04-05 MED ORDER — OXYTOCIN 40 UNITS IN LACTATED RINGERS INFUSION - SIMPLE MED
1.0000 m[IU]/min | INTRAVENOUS | Status: DC
  Administered 2015-04-05: 1 m[IU]/min via INTRAVENOUS
  Filled 2015-04-05: qty 1000

## 2015-04-05 MED ORDER — ZOLPIDEM TARTRATE 5 MG PO TABS: 5.0000 mg | ORAL_TABLET | Freq: Every evening | ORAL | Status: DC | PRN

## 2015-04-05 MED ORDER — OXYCODONE-ACETAMINOPHEN 5-325 MG PO TABS: 2.0000 | ORAL_TABLET | ORAL | Status: DC | PRN

## 2015-04-05 MED ORDER — SODIUM CHLORIDE 0.9% FLUSH: 3.0000 mL | Freq: Two times a day (BID) | INTRAVENOUS | Status: DC

## 2015-04-05 MED ORDER — LIDOCAINE-EPINEPHRINE (PF) 1.5 %-1:200000 IJ SOLN
INTRAMUSCULAR | Status: DC | PRN
  Administered 2015-04-05: 3 mL via EPIDURAL

## 2015-04-05 MED ORDER — DIPHENHYDRAMINE HCL 25 MG PO CAPS: 25.0000 mg | ORAL_CAPSULE | Freq: Four times a day (QID) | ORAL | Status: DC | PRN

## 2015-04-05 MED ORDER — SIMETHICONE 80 MG PO CHEW: 80.0000 mg | CHEWABLE_TABLET | ORAL | Status: DC | PRN

## 2015-04-05 MED ORDER — TERBUTALINE SULFATE 1 MG/ML IJ SOLN: 0.2500 mg | Freq: Once | INTRAMUSCULAR | Status: DC | PRN

## 2015-04-05 MED ORDER — WITCH HAZEL-GLYCERIN EX PADS: 1.0000 "application " | MEDICATED_PAD | CUTANEOUS | Status: DC | PRN

## 2015-04-05 MED ORDER — BENZOCAINE-MENTHOL 20-0.5 % EX AERO
1.0000 "application " | INHALATION_SPRAY | CUTANEOUS | Status: DC | PRN
  Filled 2015-04-05: qty 56

## 2015-04-05 MED ORDER — ONDANSETRON HCL 4 MG/2ML IJ SOLN: 4.0000 mg | INTRAMUSCULAR | Status: DC | PRN

## 2015-04-05 MED ORDER — LIDOCAINE HCL (PF) 1 % IJ SOLN
INTRAMUSCULAR | Status: AC
  Administered 2015-04-05: 10 mL via SUBCUTANEOUS
  Filled 2015-04-05: qty 30

## 2015-04-05 MED ORDER — SODIUM CHLORIDE 0.9 % IV SOLN
250.0000 mL | INTRAVENOUS | Status: DC | PRN
  Administered 2015-04-06 (×2): via INTRAVENOUS

## 2015-04-05 MED ORDER — DIBUCAINE 1 % RE OINT: 1.0000 "application " | TOPICAL_OINTMENT | RECTAL | Status: DC | PRN

## 2015-04-05 MED ORDER — ACETAMINOPHEN 325 MG PO TABS: 650.0000 mg | ORAL_TABLET | ORAL | Status: DC | PRN

## 2015-04-05 MED ORDER — LACTATED RINGERS IV SOLN
INTRAVENOUS | Status: DC
  Administered 2015-04-05 – 2015-04-06 (×2): via INTRAVENOUS

## 2015-04-05 MED ORDER — ONDANSETRON HCL 4 MG PO TABS: 4.0000 mg | ORAL_TABLET | ORAL | Status: DC | PRN

## 2015-04-05 MED ORDER — SODIUM CHLORIDE 0.9% FLUSH: 3.0000 mL | INTRAVENOUS | Status: DC | PRN

## 2015-04-05 MED ORDER — AMMONIA AROMATIC IN INHA
RESPIRATORY_TRACT | Status: AC
  Filled 2015-04-05: qty 10

## 2015-04-05 MED ORDER — LANOLIN HYDROUS EX OINT: TOPICAL_OINTMENT | CUTANEOUS | Status: DC | PRN

## 2015-04-05 MED ORDER — SENNOSIDES-DOCUSATE SODIUM 8.6-50 MG PO TABS
2.0000 | ORAL_TABLET | ORAL | Status: DC
  Administered 2015-04-07: 2 via ORAL
  Filled 2015-04-05: qty 2

## 2015-04-05 MED ORDER — MISOPROSTOL 200 MCG PO TABS
ORAL_TABLET | ORAL | Status: AC
  Filled 2015-04-05: qty 4

## 2015-04-05 MED ORDER — OXYTOCIN 10 UNIT/ML IJ SOLN
INTRAMUSCULAR | Status: AC
  Filled 2015-04-05: qty 2

## 2015-04-05 MED ORDER — FENTANYL 2.5 MCG/ML W/ROPIVACAINE 0.2% IN NS 100 ML EPIDURAL INFUSION (ARMC-ANES)
EPIDURAL | Status: AC
  Administered 2015-04-05: 10 mL/h via EPIDURAL
  Filled 2015-04-05: qty 100

## 2015-04-05 MED ORDER — IBUPROFEN 600 MG PO TABS
600.0000 mg | ORAL_TABLET | Freq: Four times a day (QID) | ORAL | Status: DC
Start: 2015-04-05 — End: 2015-04-06
  Administered 2015-04-06: 600 mg via ORAL
  Filled 2015-04-05: qty 1

## 2015-04-05 MED ORDER — OXYCODONE-ACETAMINOPHEN 5-325 MG PO TABS: 1.0000 | ORAL_TABLET | ORAL | Status: DC | PRN

## 2015-04-05 NOTE — Anesthesia Procedure Notes (Signed)
Epidural Patient location during procedure: OB Start time: 04/05/2015 1:51 PM End time: 04/05/2015 1:54 PM  Staffing Resident/CRNA: NOLES, MARK  Preanesthetic Checklist Completed: patient identified, site marked, surgical consent, pre-op evaluation, timeout performed, IV checked, risks and benefits discussed and monitors and equipment checked  Epidural Patient position: sitting Prep: Betadine Patient monitoring: heart rate, continuous pulse ox and blood pressure Approach: midline Location: L4-L5 Injection technique: LOR air  Needle:  Needle type: Tuohy  Needle gauge: 18 G Needle length: 9 cm and 9 Needle insertion depth: 5 cm Catheter type: closed end flexible Catheter size: 20 Guage Catheter at skin depth: 10 cm Test dose: negative and 1.5% lidocaine with Epi 1:200 K  Assessment Sensory level: T10 Events: blood not aspirated, injection not painful, no injection resistance, negative IV test and no paresthesia  Additional Notes Pt's history reviewed and consent obtained as per OB consent Patient tolerated the insertion well without complications. Negative SATD, negative IVTD All VSS were obtained and monitored through OBIX and nursing protocols followed.Reason for block:procedure for pain

## 2015-04-05 NOTE — Anesthesia Preprocedure Evaluation (Addendum)
Anesthesia Evaluation  Patient identified by MRN, date of birth, ID band Patient awake    Reviewed: Allergy & Precautions, H&P , NPO status , Patient's Chart, lab work & pertinent test results, reviewed documented beta blocker date and time   History of Anesthesia Complications Negative for: history of anesthetic complications  Airway Mallampati: II  TM Distance: >3 FB Neck ROM: full    Dental no notable dental hx. (+) Chipped   Pulmonary neg pulmonary ROS,    Pulmonary exam normal        Cardiovascular hypertension, Pt. on medications Normal cardiovascular exam     Neuro/Psych negative neurological ROS  negative psych ROS   GI/Hepatic negative GI ROS, Neg liver ROS,   Endo/Other  negative endocrine ROS  Renal/GU negative Renal ROS  negative genitourinary   Musculoskeletal   Abdominal   Peds  Hematology negative hematology ROS (+)   Anesthesia Other Findings   Reproductive/Obstetrics (+) Pregnancy                            Anesthesia Physical Anesthesia Plan  ASA: III  Anesthesia Plan: Epidural   Post-op Pain Management:    Induction:   Airway Management Planned:   Additional Equipment:   Intra-op Plan:   Post-operative Plan:   Informed Consent: I have reviewed the patients History and Physical, chart, labs and discussed the procedure including the risks, benefits and alternatives for the proposed anesthesia with the patient or authorized representative who has indicated his/her understanding and acceptance.     Plan Discussed with: Anesthesiologist  Anesthesia Plan Comments:        Anesthesia Quick Evaluation

## 2015-04-05 NOTE — Progress Notes (Signed)
RN to the bedside to assist patient with ambulation to the BR, positive for post delivery void, pericare given. Left leg a "little weak", pt. Able to ambulate to bedside commode with assist.  Instructed not to get out of postpartum bed until assessed by postpartum nurse.  Pt. Verbalized agreement. Family members remain at the bedside, supportive. Pt. Instructed NPO at midnight...BTL tomorrow.  Pt. Transferred to PP Unit for postpartum care. Stable patient.

## 2015-04-05 NOTE — Discharge Summary (Signed)
Obstetrical Discharge Summary  Date of Admission: 04/04/2015 Date of Discharge: 04/07/15 Discharge Diagnosis: Term Pregnancy-delivered Primary OB:  [facility]   Gestational Age at Delivery: [redacted]w[redacted]d  Antepartum complications: Obesity, Hypertension Date of Delivery: 04/05/15  Delivered By: Arminda Resides Delivery Type: spontaneous vaginal delivery Intrapartum complications/course: None Anesthesia: epidural Placenta: spontaneous Laceration: none Episiotomy: none Live born F Birth Weight: 7 lb 8.6 oz (3420 g) APGAR: 8, 9   Post partum course: Since the delivery, patient has tolerate activity, diet, and daily functions without difficulty or complication.  Min lochia.  No breast concerns at this time.  No signs of depression currently.   Postpartum Exam:General appearance: alert, cooperative and appears stated age  Disposition: home with infant Rh Immune globulin given: not applicable Rubella vaccine given: no Varicella vaccine given: no Tdap vaccine given in AP or PP setting: given during prenatal care Flu vaccine given in AP or PP setting: given during prenatal care Contraception: bilateral tubal ligation  Prenatal Labs: A POS//Rubella Immune//RPR negative//HIV negative/HepB Surface Ag negative//plans to bottle feed  Plan:  Cheryl Pope was discharged to home in good condition. Follow-up appointment with [facility] provider in 6 weeks  Discharge Medications:   Medication List    TAKE these medications        prenatal multivitamin Tabs tablet  Take 1 tablet by mouth daily at 12 noon.        Follow-up arrangements:   Make an appointment for postpartum follow-up in 6 weeks with Dr Tiburcio Pea or CNM Talmadge Chad, CNM   This patient and plan were discussed with Dr Vergie Living 04/07/2015

## 2015-04-05 NOTE — Discharge Instructions (Signed)

## 2015-04-05 NOTE — Progress Notes (Signed)
L&D Note  04/05/2015 - 9:00 AM  25 y.o. G2P0001 [redacted]w[redacted]d (by a 7wk1d ultrasound). Pregnancy complicated by [facility] and obesity  Patient Active Problem List   Diagnosis Date Noted  . Hypertension in antepartum pregnancy 04/04/2015  . Labor and delivery, indication for care 03/10/2015  . Nausea and vomiting of pregnancy, antepartum 02/21/2015  . Irregular contractions 02/21/2015    Cheryl Pope was admitted for IOL last night for [facility] and received Cervidil at 2230.   Subjective:  Complains of painful contractions.  Objective:   Filed Vitals:   04/05/15 0315 04/05/15 0532 04/05/15 0705 04/05/15 0800  BP: 142/73 132/73 140/76 139/94  Pulse: 83 88 90 86  Temp: 97.8 F (36.6 C)  98.1 F (36.7 C)   TempSrc: Oral  Oral   Resp: 18  19     Current Vital Signs 24h Vital Sign Ranges  T 98.1 F (36.7 C) Temp  Avg: 98.2 F (36.8 C)  Min: 97.8 F (36.6 C)  Max: 98.7 F (37.1 C)  BP (!) 139/94 mmHg BP  Min: 122/66  Max: 142/73  HR 86 Pulse  Avg: 90.2  Min: 83  Max: 99  RR 19 Resp  Avg: 18.6  Min: 18  Max: 20  SaO2   Not Delivered No Data Recorded       24 Hour I/O Current Shift I/O  Time Ins Outs   03/01 0701 - 03/01 1900 In: 250 [I.V.:250] Out: -    FHR: 125 baseline with accelerations to 160s, moderate variability Toco: contractions q 1-5 min apart, has runs of contractions at times Gen: breathing thru some contractions SVE:3/ 70&/ -1 to 0/ soft/ posterior. Cervidil removed  Labs:   Recent Labs Lab 04/04/15 2142  WBC 15.9*  HGB 9.6*  HCT 29.7*  PLT 336    Recent Labs Lab 04/04/15 2142  NA 136  K 3.7  CL 106  CO2 21*  BUN 9  CREATININE 0.79  CALCIUM 8.4*  PROT 6.1*  BILITOT 0.3  ALKPHOS 149*  ALT 11*  AST 15  GLUCOSE 94    Medications Current Facility-Administered Medications  Medication Dose Route Frequency Provider Last Rate Last Dose  . acetaminophen (TYLENOL) tablet 650 mg  650 mg Oral Q4H PRN Vena Austria, MD      . butorphanol  (STADOL) injection 1-2 mg  1-2 mg Intravenous Q3H PRN Vena Austria, MD      . citric acid-sodium citrate (ORACIT) solution 30 mL  30 mL Oral Q2H PRN Vena Austria, MD      . lactated ringers infusion 500-1,000 mL  500-1,000 mL Intravenous PRN Vena Austria, MD      . lactated ringers infusion   Intravenous Continuous Vena Austria, MD 125 mL/hr at 04/05/15 0529    . lidocaine (PF) (XYLOCAINE) 1 % injection 30 mL  30 mL Subcutaneous PRN Vena Austria, MD      . ondansetron [facility]) injection 4 mg  4 mg Intravenous Q6H PRN Vena Austria, MD      . oxytocin (PITOCIN) IV BOLUS FROM BAG  500 mL Intravenous Continuous Vena Austria, MD      . oxytocin (PITOCIN) IV infusion 40 units in LR 1000 mL - Premix  2.5 Units/hr Intravenous Continuous Vena Austria, MD      . penicillin G potassium 2.5 Million Units in dextrose 5 % 100 mL IVPB  2.5 Million Units Intravenous 6 times per day Vena Austria, MD   2.5 Million Units at 04/05/15 0528  .  terbutaline (BRETHINE) injection 0.25 mg  0.25 mg Subcutaneous Once PRN Vena Austria, MD        Assessment & Plan:   IUP at 39.2 weeks with [facility] for IOL-some progress in station after Cervidil GBS positive-has begun PPX Analgesia: received Stadol during the night, desires epidural when more active.  Farrel Conners, CNM [employer] Pager [phone]

## 2015-04-05 NOTE — Progress Notes (Addendum)
S: Rates pain 9/10. Ready for epidural O: Filed Vitals:   04/05/15 0705 04/05/15 0800 04/05/15 0900 04/05/15 1045  BP: 140/76 139/94 138/84 134/76  Pulse: 90 86 95 95  Temp: 98.1 F (36.7 C)   98.2 F (36.8 C)  TempSrc: Oral   Oral  Resp: 19        Gen: breathing thru contractions     FHT: 110s baseline with accelerations to 150s to 160, mod var,  no decelerations TOCO: Q 2-3 min with Pitocin at 7 miu/min SVE: 5/75%/-1/mid   A/P:  Progressing with Pitocin augmentation Normotensive to mild range blood pressures FWB: Reassuring Cat 1 tracing. EFW 7#14oz  GBS: positive-has received adequate treatment  Will consult anesthesia for epidural  Plan AROM after comfortable from epidural     Amad Mau 1:43 PM

## 2015-04-06 ENCOUNTER — Inpatient Hospital Stay: Payer: Medicaid Other | Admitting: Anesthesiology

## 2015-04-06 ENCOUNTER — Encounter
Admission: EM | Disposition: A | Discharge status: Home / Self Care | Payer: Self-pay | Source: Home / Self Care | Attending: Certified Nurse Midwife

## 2015-04-06 ENCOUNTER — Encounter: Payer: Self-pay | Admitting: Anesthesiology

## 2015-04-06 HISTORY — PX: TUBAL LIGATION: SHX77

## 2015-04-06 LAB — CBC
HEMATOCRIT: 26.2 % — AB (ref 35.0–47.0)
HEMOGLOBIN: 8.5 g/dL — AB (ref 12.0–16.0)
MCH: 24.9 pg — AB (ref 26.0–34.0)
MCHC: 32.5 g/dL (ref 32.0–36.0)
MCV: 76.5 fL — ABNORMAL LOW (ref 80.0–100.0)
Platelets: 302 10*3/uL (ref 150–440)
RBC: 3.42 MIL/uL — ABNORMAL LOW (ref 3.80–5.20)
RDW: 16.4 % — AB (ref 11.5–14.5)
WBC: 15.2 10*3/uL — ABNORMAL HIGH (ref 3.6–11.0)

## 2015-04-06 LAB — RPR: RPR: NONREACTIVE

## 2015-04-06 SURGERY — LIGATION, FALLOPIAN TUBE, POSTPARTUM
Anesthesia: General | Laterality: Bilateral | Wound class: Clean Contaminated

## 2015-04-06 MED ORDER — LIDOCAINE HCL (CARDIAC) 20 MG/ML IV SOLN
INTRAVENOUS | Status: DC | PRN
  Administered 2015-04-06: 60 mg via INTRAVENOUS

## 2015-04-06 MED ORDER — DEXAMETHASONE SODIUM PHOSPHATE 10 MG/ML IJ SOLN
INTRAMUSCULAR | Status: DC | PRN
  Administered 2015-04-06: 5 mg via INTRAVENOUS

## 2015-04-06 MED ORDER — BUPIVACAINE HCL 0.5 % IJ SOLN
INTRAMUSCULAR | Status: DC | PRN
  Administered 2015-04-06: 9 mL

## 2015-04-06 MED ORDER — FENTANYL CITRATE (PF) 100 MCG/2ML IJ SOLN
INTRAMUSCULAR | Status: DC | PRN
  Administered 2015-04-06: 100 ug via INTRAVENOUS

## 2015-04-06 MED ORDER — FENTANYL CITRATE (PF) 100 MCG/2ML IJ SOLN
INTRAMUSCULAR | Status: AC
  Administered 2015-04-06: 25 ug via INTRAVENOUS
  Filled 2015-04-06: qty 2

## 2015-04-06 MED ORDER — KETOROLAC TROMETHAMINE 30 MG/ML IJ SOLN
INTRAMUSCULAR | Status: DC | PRN
  Administered 2015-04-06: 30 mg via INTRAVENOUS

## 2015-04-06 MED ORDER — IBUPROFEN 600 MG PO TABS
600.0000 mg | ORAL_TABLET | Freq: Four times a day (QID) | ORAL | Status: DC
  Administered 2015-04-06: 600 mg via ORAL
  Filled 2015-04-06: qty 1

## 2015-04-06 MED ORDER — ONDANSETRON HCL 4 MG/2ML IJ SOLN
INTRAMUSCULAR | Status: DC | PRN
  Administered 2015-04-06: 4 mg via INTRAVENOUS

## 2015-04-06 MED ORDER — BUPIVACAINE HCL (PF) 0.5 % IJ SOLN
INTRAMUSCULAR | Status: AC
  Filled 2015-04-06: qty 30

## 2015-04-06 MED ORDER — GLYCOPYRROLATE 0.2 MG/ML IJ SOLN
INTRAMUSCULAR | Status: DC | PRN
  Administered 2015-04-06: 0.3 mg via INTRAVENOUS

## 2015-04-06 MED ORDER — LACTATED RINGERS IV SOLN
INTRAVENOUS | Status: DC | PRN
  Administered 2015-04-06: 14:00:00 via INTRAVENOUS

## 2015-04-06 MED ORDER — ONDANSETRON HCL 4 MG/2ML IJ SOLN: 4.0000 mg | Freq: Once | INTRAMUSCULAR | Status: DC | PRN

## 2015-04-06 MED ORDER — NEOSTIGMINE METHYLSULFATE 10 MG/10ML IV SOLN
INTRAVENOUS | Status: DC | PRN
  Administered 2015-04-06: 2.5 mg via INTRAVENOUS

## 2015-04-06 MED ORDER — IBUPROFEN 600 MG PO TABS
600.0000 mg | ORAL_TABLET | Freq: Four times a day (QID) | ORAL | Status: DC
  Administered 2015-04-07 (×2): 600 mg via ORAL
  Filled 2015-04-06 (×2): qty 1

## 2015-04-06 MED ORDER — PROPOFOL 10 MG/ML IV BOLUS
INTRAVENOUS | Status: DC | PRN
  Administered 2015-04-06: 180 mg via INTRAVENOUS

## 2015-04-06 MED ORDER — SUCCINYLCHOLINE CHLORIDE 20 MG/ML IJ SOLN
INTRAMUSCULAR | Status: DC | PRN
  Administered 2015-04-06: 100 mg via INTRAVENOUS

## 2015-04-06 MED ORDER — FENTANYL CITRATE (PF) 100 MCG/2ML IJ SOLN
25.0000 ug | INTRAMUSCULAR | Status: DC | PRN
  Administered 2015-04-06 (×4): 25 ug via INTRAVENOUS

## 2015-04-06 MED ORDER — MIDAZOLAM HCL 5 MG/5ML IJ SOLN
INTRAMUSCULAR | Status: DC | PRN
  Administered 2015-04-06: 2 mg via INTRAVENOUS

## 2015-04-06 MED ORDER — ROCURONIUM BROMIDE 100 MG/10ML IV SOLN
INTRAVENOUS | Status: DC | PRN
  Administered 2015-04-06: 10 mg via INTRAVENOUS

## 2015-04-06 MED ORDER — LANOLIN HYDROUS EX OINT: TOPICAL_OINTMENT | CUTANEOUS | Status: DC | PRN

## 2015-04-06 SURGICAL SUPPLY — 26 items
CHLORAPREP W/TINT 26ML (MISCELLANEOUS) ×3 IMPLANT
DERMABOND ADVANCED (GAUZE/BANDAGES/DRESSINGS) ×2
DERMABOND ADVANCED .7 DNX12 (GAUZE/BANDAGES/DRESSINGS) ×1 IMPLANT
DRAPE LAPAROTOMY T 102X78X121 (DRAPES) ×3 IMPLANT
DRESSING TELFA 4X3 1S ST N-ADH (GAUZE/BANDAGES/DRESSINGS) IMPLANT
DRSG TEGADERM 2-3/8X2-3/4 SM (GAUZE/BANDAGES/DRESSINGS) IMPLANT
ELECT CAUTERY BLADE 6.4 (BLADE) ×3 IMPLANT
ELECT REM PT RETURN 9FT ADLT (ELECTROSURGICAL) ×3
ELECTRODE REM PT RTRN 9FT ADLT (ELECTROSURGICAL) ×1 IMPLANT
GLOVE BIO SURGEON STRL SZ8 (GLOVE) ×6 IMPLANT
GOWN STRL REUS W/ TWL LRG LVL3 (GOWN DISPOSABLE) ×1 IMPLANT
GOWN STRL REUS W/ TWL XL LVL3 (GOWN DISPOSABLE) ×1 IMPLANT
GOWN STRL REUS W/TWL LRG LVL3 (GOWN DISPOSABLE) ×2
GOWN STRL REUS W/TWL XL LVL3 (GOWN DISPOSABLE) ×2
LABEL OR SOLS (LABEL) ×3 IMPLANT
LIQUID BAND (GAUZE/BANDAGES/DRESSINGS) ×3 IMPLANT
NDL SAFETY 22GX1.5 (NEEDLE) ×3 IMPLANT
NS IRRIG 500ML POUR BTL (IV SOLUTION) ×3 IMPLANT
PACK BASIN MINOR ARMC (MISCELLANEOUS) ×3 IMPLANT
RETRACTOR WOUND ALXS 18CM SML (MISCELLANEOUS) ×1 IMPLANT
RTRCTR WOUND ALEXIS O 18CM SML (MISCELLANEOUS) ×3
STRAP SAFETY BODY (MISCELLANEOUS) ×3 IMPLANT
SUT VIC AB 0 SH 27 (SUTURE) ×3 IMPLANT
SUT VIC AB 2-0 UR6 27 (SUTURE) ×3 IMPLANT
SUT VIC AB 4-0 PS2 18 (SUTURE) IMPLANT
SYRINGE 10CC LL (SYRINGE) ×3 IMPLANT

## 2015-04-06 NOTE — Anesthesia Preprocedure Evaluation (Addendum)
Anesthesia Evaluation  Patient identified by MRN, date of birth, ID band Patient awake    Reviewed: Allergy & Precautions, H&P , NPO status , Patient's Chart, lab work & pertinent test results, reviewed documented beta blocker date and time   History of Anesthesia Complications Negative for: history of anesthetic complications  Airway Mallampati: II  TM Distance: >3 FB Neck ROM: full    Dental no notable dental hx. (+) Chipped   Pulmonary neg pulmonary ROS,    Pulmonary exam normal        Cardiovascular hypertension, Pt. on medications Normal cardiovascular exam     Neuro/Psych negative neurological ROS  negative psych ROS   GI/Hepatic negative GI ROS, Neg liver ROS,   Endo/Other  negative endocrine ROS  Renal/GU negative Renal ROS  negative genitourinary   Musculoskeletal negative musculoskeletal ROS (+)   Abdominal   Peds negative pediatric ROS (+)  Hematology negative hematology ROS (+)   Anesthesia Other Findings   Reproductive/Obstetrics (+) Pregnancy                            Anesthesia Physical Anesthesia Plan  ASA: II  Anesthesia Plan: General   Post-op Pain Management:    Induction: Intravenous, Rapid sequence and Cricoid pressure planned  Airway Management Planned: Oral ETT  Additional Equipment:   Intra-op Plan:   Post-operative Plan: Extubation in OR  Informed Consent: I have reviewed the patients History and Physical, chart, labs and discussed the procedure including the risks, benefits and alternatives for the proposed anesthesia with the patient or authorized representative who has indicated his/her understanding and acceptance.   Dental advisory given  Plan Discussed with: CRNA and Surgeon  Anesthesia Plan Comments:        Anesthesia Quick Evaluation

## 2015-04-06 NOTE — Anesthesia Postprocedure Evaluation (Signed)
Anesthesia Post Note  Patient: Cheryl Pope  Procedure(s) Performed: Procedure(s) (LRB): POST PARTUM TUBAL LIGATION (Bilateral)  Patient location during evaluation: PACU Anesthesia Type: General Level of consciousness: awake and alert and oriented Pain management: pain level controlled Vital Signs Assessment: post-procedure vital signs reviewed and stable Respiratory status: spontaneous breathing Cardiovascular status: blood pressure returned to baseline Anesthetic complications: no    Last Vitals:  Filed Vitals:   04/06/15 1145 04/06/15 1408  BP: 128/56 142/88  Pulse: 97 72  Temp: 36.3 C 37.5 C  Resp: 18 16    Last Pain:  Filed Vitals:   04/06/15 1414  PainSc: Asleep                 Jolonda Gomm

## 2015-04-06 NOTE — Progress Notes (Signed)
  Postpartum Day 1  Subjective: no complaints and up ad lib  Objective: Blood pressure 127/73, pulse 88, temperature 98 F (36.7 C), temperature source Oral, resp. rate 18, height  (1.676 m), weight 230 lb (104.327 kg), last menstrual period 06/28/2014, SpO2 98 %  Physical Exam:  General: alert and cooperative Lochia: appropriate Uterine Fundus: firm Incision: N/A DVT Evaluation: No evidence of DVT seen on physical exam. Abdomen: soft, NT   Recent Labs  04/04/15 2142 04/06/15 0551  HGB 9.6* 8.5*  HCT 29.7* 26.2*    Assessment PPD #1  Plan: Continue PP care  Plan for PP BTL today  Feeding: bottle Contraception: BTL Blood Type: A+ RI/VI TDAP UTD Flu UTD    Marta Antu, [place] 04/06/2015, 10:01 AM

## 2015-04-06 NOTE — Transfer of Care (Signed)
Immediate Anesthesia Transfer of Care Note  Patient: Cheryl Pope  Procedure(s) Performed: Procedure(s): POST PARTUM TUBAL LIGATION (Bilateral)  Patient Location: PACU  Anesthesia Type:General  Level of Consciousness: awake and patient cooperative  Airway & Oxygen Therapy: Patient Spontanous Breathing and Patient connected to nasal cannula oxygen  Post-op Assessment: Report given to RN and Post -op Vital signs reviewed and stable  Post vital signs: Reviewed and stable  Last Vitals:  Filed Vitals:   04/06/15 1145 04/06/15 1408  BP: 128/56 142/88  Pulse: 97 72  Temp: 36.3 C 37.5 C  Resp: 18 16    Complications: No apparent anesthesia complications

## 2015-04-06 NOTE — Op Note (Signed)
Operative Note  04/06/2015  PRE-OP DIAGNOSIS: Desire for sterility  POST-OP DIAGNOSIS: same  SURGEON: Annamarie Major, MD, FACOG  PROCEDURE: Postpartum Bilateral Tubal Ligation Procedure   ANESTHESIA: General   ESTIMATED BLOOD LOSS: 10 mL  SPECIMENS: Portion of left and right tube  COMPLICATIONS: None  DISPOSITION: PACU - hemodynamically stable.  CONDITION: stable  FINDINGS: Exam under anesthesia revealed small, mobile   uterus with no masses and bilateral adnexa without masses or fullness.   TECHNIQUE:  Patient is prepped and draped in usual sterile fashion after adequate anesthesia is obtained in the supine position on the operating room table.  Local anesthesia is injected into the skin just inferior to the umbilicus, followed by a small elliptical incision with a scapel.  Fascia is identified and tented upwards, and an incision is made with Mayo scissors.  Identification of no adherent bowel is made. Retractors are placed and trendelenburg positioning is achieved.    The left Fallopian tube was identified, grasped with the Babcock clamps, lifted to the skin incision and followed out distally to the fimbriae. An avascular midsection of the tube approximately 3-4cm from the cornua was grasped with the babcock clamps and brought into a knuckle at the skin incision. The tube was double ligated with 2-0 Vicryl suture and the intervening portion of tube was transected and removed. Excellent hemostasis was noted and the tube was returned to the abdomen. Attention was then turned to the right fallopian tube after confirmation of identification by tracing the tube out to the fimbriae. The same procedure was then performed on the right Fallopian tube. Again, excellent hemostasis was noted at the end of the procedure.  Retractors are removed and fascia closed with a 2-0 Vicryl suture. Irrigation and hemostasis confirmed.  Skin closed with a 4-0 vicryl suture in a subcuticular fashion followed by  skin adhesive.  Pt goes to recovery room in stable fashion.  All counts correct times 2.

## 2015-04-06 NOTE — Anesthesia Procedure Notes (Signed)
Procedure Name: Intubation Date/Time: 04/06/2015 1:19 PM Performed by: Lily Kocher Pre-anesthesia Checklist: Patient identified, Patient being monitored, Timeout performed, Emergency Drugs available and Suction available Patient Re-evaluated:Patient Re-evaluated prior to inductionOxygen Delivery Method: Circle system utilized Preoxygenation: Pre-oxygenation with 100% oxygen Intubation Type: IV induction Ventilation: Mask ventilation without difficulty Laryngoscope Size: Mac and 3 Grade View: Grade I Tube type: Oral Tube size: 7.0 mm Number of attempts: 1 Airway Equipment and Method: Stylet Placement Confirmation: ETT inserted through vocal cords under direct vision,  positive ETCO2 and breath sounds checked- equal and bilateral Secured at: 21 cm Tube secured with: Tape Dental Injury: Teeth and Oropharynx as per pre-operative assessment

## 2015-04-06 NOTE — Anesthesia Postprocedure Evaluation (Signed)
Anesthesia Post Note  Patient: Cheryl Pope  Procedure(s) Performed: * No procedures listed *  Patient location during evaluation: Mother Baby Anesthesia Type: Epidural Level of consciousness: awake and alert Pain management: pain level controlled Vital Signs Assessment: post-procedure vital signs reviewed and stable Respiratory status: spontaneous breathing, nonlabored ventilation and respiratory function stable Cardiovascular status: stable Postop Assessment: no headache, no backache and epidural receding Anesthetic complications: no    Last Vitals:  Filed Vitals:   04/06/15 0301 04/06/15 0732  BP: 110/65 127/73  Pulse: 87 88  Temp: 36.9 C 36.7 C  Resp: 18 18    Last Pain:  Filed Vitals:   04/06/15 0748  PainSc: 4                  Starling Manns

## 2015-04-06 NOTE — Progress Notes (Signed)
History and Physical Interval Note:  04/06/2015 12:57 PM  Cheryl Pope  has presented today for surgery, with the diagnosis of Desires permanent sterilization  The various methods of treatment have been discussed with the patient and family. After consideration of risks, benefits and other options for treatment, the patient has consented to  Procedure(s): POST PARTUM TUBAL LIGATION (Bilateral) as a surgical intervention .  The patient's history has been reviewed, patient examined, no change in status, stable for surgery.  Pt has the following beta blocker history-  Not taking Beta Blocker.  I have reviewed the patient's chart and labs.  Questions were answered to the patient's satisfaction.       [name]

## 2015-04-07 LAB — SURGICAL PATHOLOGY

## 2015-04-07 MED ORDER — MEASLES, MUMPS & RUBELLA VAC ~~LOC~~ INJ
0.5000 mL | INJECTION | Freq: Once | SUBCUTANEOUS | Status: AC
  Administered 2015-04-07: 0.5 mL via SUBCUTANEOUS
  Filled 2015-04-07: qty 0.5

## 2015-04-07 NOTE — Progress Notes (Signed)
Patient discharged home with infant and family. Discharge instructions, prescriptions and follow up appointment given to and reviewed with patient and family. Patient verbalized understanding. Escorted out via wheelchair by auxillary.

## 2015-04-07 NOTE — Plan of Care (Signed)
Problem: Nutritional: Goal: Mother's verbalization of comfort with breastfeeding process will improve Outcome: Not Applicable Date Met:  78/93/81 Patient is bottle feeding infant.

## 2016-04-01 DIAGNOSIS — R5383 Other fatigue: Secondary | ICD-10-CM | POA: Diagnosis not present

## 2016-04-01 DIAGNOSIS — R350 Frequency of micturition: Secondary | ICD-10-CM | POA: Diagnosis not present

## 2017-05-22 DIAGNOSIS — R5381 Other malaise: Secondary | ICD-10-CM | POA: Diagnosis not present

## 2017-05-22 DIAGNOSIS — R5383 Other fatigue: Secondary | ICD-10-CM | POA: Diagnosis not present

## 2017-06-05 ENCOUNTER — Inpatient Hospital Stay: Payer: 59 | Attending: Hematology and Oncology | Admitting: Hematology and Oncology

## 2017-06-05 ENCOUNTER — Encounter: Payer: Self-pay | Admitting: Hematology and Oncology

## 2017-06-05 ENCOUNTER — Other Ambulatory Visit: Payer: Self-pay

## 2017-06-05 ENCOUNTER — Inpatient Hospital Stay: Payer: 59

## 2017-06-05 VITALS — BP 143/94 | HR 85 | Temp 98.2°F | Resp 16 | Wt 278.0 lb

## 2017-06-05 DIAGNOSIS — F329 Major depressive disorder, single episode, unspecified: Secondary | ICD-10-CM | POA: Diagnosis not present

## 2017-06-05 DIAGNOSIS — Z79899 Other long term (current) drug therapy: Secondary | ICD-10-CM | POA: Diagnosis not present

## 2017-06-05 DIAGNOSIS — I10 Essential (primary) hypertension: Secondary | ICD-10-CM | POA: Diagnosis not present

## 2017-06-05 DIAGNOSIS — D509 Iron deficiency anemia, unspecified: Secondary | ICD-10-CM | POA: Diagnosis not present

## 2017-06-05 DIAGNOSIS — D72829 Elevated white blood cell count, unspecified: Secondary | ICD-10-CM | POA: Insufficient documentation

## 2017-06-05 DIAGNOSIS — Z800 Family history of malignant neoplasm of digestive organs: Secondary | ICD-10-CM | POA: Insufficient documentation

## 2017-06-05 DIAGNOSIS — N920 Excessive and frequent menstruation with regular cycle: Secondary | ICD-10-CM | POA: Diagnosis not present

## 2017-06-05 LAB — CBC WITH DIFFERENTIAL/PLATELET
Basophils Absolute: 0.1 10*3/uL (ref 0–0.1)
Basophils Relative: 1 %
EOS PCT: 2 %
Eosinophils Absolute: 0.2 10*3/uL (ref 0–0.7)
HCT: 36.5 % (ref 35.0–47.0)
Hemoglobin: 11.7 g/dL — ABNORMAL LOW (ref 12.0–16.0)
LYMPHS PCT: 32 %
Lymphs Abs: 4.2 10*3/uL — ABNORMAL HIGH (ref 1.0–3.6)
MCH: 23.8 pg — ABNORMAL LOW (ref 26.0–34.0)
MCHC: 32.1 g/dL (ref 32.0–36.0)
MCV: 73.9 fL — ABNORMAL LOW (ref 80.0–100.0)
MONOS PCT: 4 %
Monocytes Absolute: 0.5 10*3/uL (ref 0.2–0.9)
Neutro Abs: 8.2 10*3/uL — ABNORMAL HIGH (ref 1.4–6.5)
Neutrophils Relative %: 61 %
PLATELETS: 418 10*3/uL (ref 150–440)
RBC: 4.94 MIL/uL (ref 3.80–5.20)
RDW: 17.2 % — ABNORMAL HIGH (ref 11.5–14.5)
WBC: 13.3 10*3/uL — AB (ref 3.6–11.0)

## 2017-06-05 LAB — C-REACTIVE PROTEIN: CRP: 0.9 mg/dL (ref <1.0)

## 2017-06-05 LAB — SEDIMENTATION RATE: Sed Rate: 8 mm/hr (ref 0–20)

## 2017-06-05 LAB — PATHOLOGIST SMEAR REVIEW

## 2017-06-05 NOTE — Progress Notes (Signed)
[facility]-  Cancer Center  Clinic day:  06/05/2017  Chief Complaint: Cheryl Pope is a 27 y.o. female with an elevated white blood cell count who is referred in consultation by Dr. Barbette Reichmann for assessment and management.  HPI:  The patient describes anemia for the last 8-9 years.  Etiology is unknown.  Her diet is adequate and rich in iron. She consumes meat and green leafy vegetables at least 4-5 times per week. Patient started on daily oral iron supplementation about 2 weeks ago. She denies ice pica. She describes worsening restless leg symptoms over the course of the last 2 months.  Menstrual cycles last for 5 days at a time. She notes that the first 2 days of her periods are "heavy", requiring her to change her tampon every 2 hours, while also wearing a pad.  She never becomes dizzy or lightheaded during her menses.  She denies any history of recurrent infections.  She denies any new medications or herbal products. She has not recently been prescribed any topical/inhaled/oral corticosteroids. She denies pain today in the clinic.  She "sometimes" has pain in her knees and hands.   CBC on 05/22/2017 revealed a hematocrit of 37.9, hemoglobin 11.4, MCV 78, platelets 438,000, WBC 15,300, and ANC 9310.  Differential included 61% segs, 33% lymphs, 4% monocytes, and 2% eosinophils.  CMP was normal.  Ferritin was 17.  B12 was 452.  TSH was normal.  UA revealed no blood.  CBC on 04/01/2016 revealed a hematocrit of 36.0, hemoglobin 10.7, MCV 74.1, platelets 475,000, WBC 15,100, and ANC 9620.  Differential included 64% segs, 28% lymphs, 5% monocytes, and 4% eosinophils.  CMP was normal.  Review of labs in Epic dating back to 08/23/2009 - 04/06/2015 reveals a WBC 15,200 - 21, 600 without trend.  Differential predominantly neutrophils.  Hemoglobin 8.5 - 12.2.  MCV 76 - 80.  Platelets 302,000 - 580,000.  She complains of increasing fatigue over the last 3 months. She denies  any B symptoms or recurrent infections. She does note a change in her bowel habits over the last 2-3 months. Stools have become more loose. Patient has occasional BRBPR, however this is attributed to known hemorrhoidal bleeding that has been persistent since the birth of her son two years ago.  She has never had a colonoscopy.   She confirms a family history that is significant for various cancers. Mother has skin cancer x 2.  Paternal grandfather had colorectal and lung cancer. Paternal great-grandfather had colon cancer.  Maternal grandfather had leukemia and lymphoma.     Past Medical History:  Diagnosis Date  . Anemia   . Chronic hypertension   . Depression   . Pregnancy induced hypertension     Past Surgical History:  Procedure Laterality Date  . TONSILLECTOMY    . TUBAL LIGATION Bilateral 04/06/2015   Procedure: POST PARTUM TUBAL LIGATION;  Surgeon: Nadara Mustard, MD;  Location: [facility] ORS;  Service: Gynecology;  Laterality: Bilateral;    Family History  Problem Relation Age of Onset  . Heart disease Father   . Kidney disease Paternal Uncle   . Skin cancer Mother   . Leukemia Maternal Grandfather   . Lung cancer Paternal Grandfather   . Colon cancer Paternal Grandfather   . Cancer Neg Hx     Social History:  reports that she has never smoked. She has never used smokeless tobacco. She reports that she does not drink alcohol or use drugs.  She has  2 children (age 46 and 5).  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 Patient denies known exposures to radiation or toxins. The patient is accompanied by her aunt today.  Allergies:  Allergies  Allergen Reactions  . Codeine Swelling    Current Medications: Current Outpatient Medications  Medication Sig Dispense Refill  . escitalopram (LEXAPRO) 10 MG tablet Take 10 mg by mouth daily.    . ferrous sulfate 325 (65 FE) MG tablet Take 325 mg by mouth daily with breakfast.     No current facility-administered  medications for this visit.     Review of Systems:  GENERAL:  Feels fatigued x 3 months. No fevers, sweats or weight loss. PERFORMANCE STATUS (ECOG):  0 HEENT:  No visual changes, runny nose, sore throat, mouth sores or tenderness. Lungs: No shortness of breath or cough.  No hemoptysis. Cardiac:  No chest pain, palpitations, orthopnea, or PND. GI:  Loose stools x 2-3 months. Hemorrhoidal bleeding. No nausea, vomiting, diarrhea, constipation, melena or hematochezia. GU:  Heavy menses. No urgency, frequency, dysuria, or hematuria. Musculoskeletal:  Polyarthralgia; knees and hands. No back pain.  No joint pain.  No muscle tenderness. Extremities:  Restless legs. No pain or swelling. Skin:  No rashes or skin changes. Neuro:  No headache, numbness or weakness, balance or coordination issues. Endocrine:  No diabetes, thyroid issues, hot flashes or night sweats. Psych:  No mood changes, depression or anxiety. Pain:  No focal pain. Review of systems:  All other systems reviewed and found to be negative.  Physical Exam: Blood pressure (!) 143/94, pulse 85, temperature 98.2 F (36.8 C), temperature source Tympanic, resp. rate 16, weight 278 lb (126.1 kg), SpO2 98 %, unknown if currently breastfeeding. GENERAL:  Well developed, well nourished, heavyset woman sitting comfortably in the exam room in no acute distress. MENTAL STATUS:  Alert and oriented to person, place and time. HEAD:  Long brown hair.  Normocephalic, atraumatic, face symmetric, no Cushingoid features. EYES:  Brown eyes.  Pupils equal round and reactive to light and accomodation.  No conjunctivitis or scleral icterus. ENT:  Oropharynx clear without lesion.  Tongue normal. Mucous membranes moist.  RESPIRATORY:  Clear to auscultation without rales, wheezes or rhonchi. CARDIOVASCULAR:  Regular rate and rhythm without murmur, rub or gallop. ABDOMEN:  Soft, non-tender, with active bowel sounds, and no hepatosplenomegaly.  No  masses. SKIN:  No rashes, ulcers or lesions. EXTREMITIES: No edema, no skin discoloration or tenderness.  No palpable cords. LYMPH NODES: No palpable cervical, supraclavicular, axillary or inguinal adenopathy  NEUROLOGICAL: Unremarkable. PSYCH:  Appropriate.   Office Visit on 06/05/2017  Component Date Value Ref Range Status  . Path Review 06/05/2017 Smear review shows a mild leukocytosis of 13.3 with mainly neutrophils and lymphocytes. Morphology appears normal. Dr. Excell Seltzer.   Final   Performed at [facility], [address], [place], [place] [address]  . WBC 06/05/2017 13.3* 3.6 - 11.0 K/uL Final  . RBC 06/05/2017 4.94  3.80 - 5.20 MIL/uL Final  . Hemoglobin 06/05/2017 11.7* 12.0 - 16.0 g/dL Final  . HCT 60/45/4098 36.5  35.0 - 47.0 % Final  . MCV 06/05/2017 73.9* 80.0 - 100.0 fL Final  . MCH 06/05/2017 23.8* 26.0 - 34.0 pg Final  . MCHC 06/05/2017 32.1  32.0 - 36.0 g/dL Final  . RDW 11/91/4782 17.2* 11.5 - 14.5 % Final  . Platelets 06/05/2017 418  150 - 440 K/uL Final  . Neutrophils Relative % 06/05/2017 61  %  Final  . Neutro Abs 06/05/2017 8.2* 1.4 - 6.5 K/uL Final  . Lymphocytes Relative 06/05/2017 32  % Final  . Lymphs Abs 06/05/2017 4.2* 1.0 - 3.6 K/uL Final  . Monocytes Relative 06/05/2017 4  % Final  . Monocytes Absolute 06/05/2017 0.5  0.2 - 0.9 K/uL Final  . Eosinophils Relative 06/05/2017 2  % Final  . Eosinophils Absolute 06/05/2017 0.2  0 - 0.7 K/uL Final  . Basophils Relative 06/05/2017 1  % Final  . Basophils Absolute 06/05/2017 0.1  0 - 0.1 K/uL Final   Performed at [facility], [address] [place], [place] [address]  . Sed Rate 06/05/2017 8  0 - 20 mm/hr Final   Performed at [facility], [address] [place] [place], [place] [address]    Assessment:  AMAKA GLUTH is a 27 y.o. 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  WBC 15,200 - 21, 600 without trend.  Differential is predominantly neutrophils.   She has  iron deficiency.  MCV is low.  Ferritin is 17.  Etiology is unclear.  Diet appears good.  She denies pica.  She has regular menses with 2 days of heavy flow.  She describes intermittent rectal bleeding that she relates to hemorrhoids.  She has been on oral iron x 2 weeks.  She has a significant family history of colon cancer (paternal grandfather and paternal great grandfather).  Maternal grandfather had lymphoma at age 52 then leukemia at age 59.  Symptomatically, she is fatigued. Bowels have been loose x 2-3 months.  She describes intermittent hemorrhoidal bleeding.  Knees and fingers ache.  Exam is unremarkable.   Plan: 1.  Labs today:  CBC with diff, sed rate, CRP, BCR-ABL, ANA, RF. 2.  Peripheral smear for pathologist review. 3.  Discuss mild leukocytosis.  Etiology appears reactive.  Doubt myeloproliferative disorder.  Discuss work-up. 4.  Discuss microcytic hypochromic RBC indices consistent with iron deficiency anemia. Started oral iron x 2 weeks ago. Advised patient to increase to BID, and to be sure that supplement is taken with a source of vitamin C.  May need to pursue a GI work-up. 5.  RTC in 2 weeks for MD assessment, labs (CBC with diff), review of workup, and discussion regarding direction of therapy.    Quentin Mulling, NP  06/05/2017, 5:35 PM   I saw and evaluated the patient, participating in the key portions of the service and reviewing pertinent diagnostic studies and records.  I reviewed the nurse practitioner's note and agree with the findings and the plan.  The assessment and plan were discussed with the patient.  Multiple questions were asked by the patient and answered.   Nelva Nay, MD 06/05/2017, 5:35 PM

## 2017-06-05 NOTE — Progress Notes (Signed)
New patient in for evaluation for elevated white blood count and continued extreme fatigue.  Pt"s Aunt is with her today.

## 2017-06-06 LAB — ANA W/REFLEX: Anti Nuclear Antibody(ANA): NEGATIVE

## 2017-06-06 LAB — RHEUMATOID FACTOR

## 2017-06-07 DIAGNOSIS — D509 Iron deficiency anemia, unspecified: Secondary | ICD-10-CM | POA: Insufficient documentation

## 2017-06-14 LAB — BCR-ABL1 FISH
CELLS COUNTED: 200
Cells Analyzed: 200

## 2017-06-18 NOTE — Progress Notes (Signed)
[facility]-  Cancer Center  Clinic day:  06/19/2017  Chief Complaint: Cheryl Pope is a 27 y.o. female with an elevated white blood cell count who is seen for review of work-up and discussion regarding direction of therapy.  HPI: Patient was last seen in the hematology clinic on 06/05/2017 for an initial evaluation.  At that time, patient complained of progressive fatigue over the course of the last 3 months.  She noted that her stools had been more loose over the last 2 to 3 months.  Occasional BRBPR was attributed to known hemorrhoidal bleeding since the birth of her child 2 years ago.  She never had a colonoscopy.  She had been on oral iron x 2 weeks.  Exam was unremarkable.  Labs on 06/05/2017  revealed a WBC of 13,300  (ANC 8200).  Hemoglobin was 11.7, hematocrit 36.5, MCV 73.9, MCH 23.8, and platelets of 418,000.  Peripheral  smear review noted a mild leukocytosis with mainly neutrophils and lymphocytes observed.  Morphology appeared normal. CRP was normal at 0.9.  BCR-ABL FISH revealed no BCR or ABL gene rearrangement.  ANA was negative.  Sedimentation rate was normal at 8.  Rheumatoid factor was normal at < 10.  In the interim, patient continues to be fatigued. Etiology remains unclear. She denies continued loose stools. Her joint pain is "about the same". She denies B symptoms and recent infection.   Patient is eating well. Weight is up 3 pounds. Patient denies pain.    Past Medical History:  Diagnosis Date  . Anemia   . Chronic hypertension   . Depression   . Pregnancy induced hypertension     Past Surgical History:  Procedure Laterality Date  . TONSILLECTOMY    . TUBAL LIGATION Bilateral 04/06/2015   Procedure: POST PARTUM TUBAL LIGATION;  Surgeon: Nadara Mustard, MD;  Location: [facility] ORS;  Service: Gynecology;  Laterality: Bilateral;    Family History  Problem Relation Age of Onset  . Heart disease Father   . Kidney disease Paternal Uncle   .  Skin cancer Mother   . Leukemia Maternal Grandfather   . Lung cancer Paternal Grandfather   . Colon cancer Paternal Grandfather   . Cancer Neg Hx     Social History:  reports that she has never smoked. She has never used smokeless tobacco. She reports that she does not drink alcohol or use drugs.  She has 2 children (age 25 and 5).  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 Patient denies known exposures to radiation or toxins. The patient is alone today.  Allergies:  Allergies  Allergen Reactions  . Codeine Swelling    Current Medications: Current Outpatient Medications  Medication Sig Dispense Refill  . ferrous sulfate 325 (65 FE) MG tablet Take 325 mg by mouth daily with breakfast.    . traZODone (DESYREL) 50 MG tablet Take 50 mg by mouth at bedtime.    Marland Kitchen escitalopram (LEXAPRO) 10 MG tablet Take 10 mg by mouth daily.     No current facility-administered medications for this visit.     Review of Systems  Constitutional: Positive for malaise/fatigue. Negative for diaphoresis, fever and weight loss (weight up 3 pounds).  HENT: Negative.  Negative for congestion, ear discharge, ear pain, hearing loss, nosebleeds, sinus pain, sore throat and tinnitus.   Eyes: Negative.  Negative for blurred vision, double vision and photophobia.  Respiratory: Negative.  Negative for cough, hemoptysis, sputum production and shortness of breath.  Denies sleep apnea  Cardiovascular: Negative.  Negative for chest pain, palpitations, orthopnea, leg swelling and PND.  Gastrointestinal: Positive for blood in stool (related to known hemorrhoids). Negative for abdominal pain, constipation, diarrhea, melena, nausea and vomiting.       Loose stools x 2-3 months; improved  Genitourinary: Negative.  Negative for dysuria, frequency, hematuria and urgency.       Heavy menses  Musculoskeletal: Positive for joint pain (polyarthralgia; mainly knees and hands). Negative for back pain, falls and  myalgias.  Skin: Negative.  Negative for itching and rash.  Neurological: Negative for dizziness, tremors, weakness and headaches.       Restless legs  Endo/Heme/Allergies: Negative.  Does not bruise/bleed easily.  Psychiatric/Behavioral: Negative.  Negative for depression, memory loss and suicidal ideas. The patient is not nervous/anxious and does not have insomnia.   All other systems reviewed and are negative.  Physical Exam: Blood pressure 136/84, pulse 86, temperature 98.3 F (36.8 C), temperature source Tympanic, resp. rate 18, height  (1.676 m), weight 281 lb 11.2 oz (127.8 kg), SpO2 97 %, unknown if currently breastfeeding. GENERAL:  Well developed, well nourished, heavyset woman sitting comfortably in the exam room in no acute distress. MENTAL STATUS:  Alert and oriented to person, place and time. HEAD:  Long brown hair.  Normocephalic, atraumatic, face symmetric, no Cushingoid features. EYES:  Brown eyes.  No conjunctivitis or scleral icterus. NEUROLOGICAL: Unremarkable. PSYCH:  Appropriate.    Appointment on 06/19/2017  Component Date Value Ref Range Status  . WBC 06/19/2017 12.5* 3.6 - 11.0 K/uL Final  . RBC 06/19/2017 5.06  3.80 - 5.20 MIL/uL Final  . Hemoglobin 06/19/2017 12.0  12.0 - 16.0 g/dL Final  . HCT 16/11/9602 37.8  35.0 - 47.0 % Final  . MCV 06/19/2017 74.6* 80.0 - 100.0 fL Final  . MCH 06/19/2017 23.7* 26.0 - 34.0 pg Final  . MCHC 06/19/2017 31.8* 32.0 - 36.0 g/dL Final  . RDW 54/10/8117 17.8* 11.5 - 14.5 % Final  . Platelets 06/19/2017 412  150 - 440 K/uL Final  . Neutrophils Relative % 06/19/2017 62  % Final  . Neutro Abs 06/19/2017 7.8* 1.4 - 6.5 K/uL Final  . Lymphocytes Relative 06/19/2017 30  % Final  . Lymphs Abs 06/19/2017 3.8* 1.0 - 3.6 K/uL Final  . Monocytes Relative 06/19/2017 4  % Final  . Monocytes Absolute 06/19/2017 0.5  0.2 - 0.9 K/uL Final  . Eosinophils Relative 06/19/2017 3  % Final  . Eosinophils Absolute 06/19/2017 0.3  0 - 0.7  K/uL Final  . Basophils Relative 06/19/2017 1  % Final  . Basophils Absolute 06/19/2017 0.1  0 - 0.1 K/uL Final   Performed at [facility], [address] [place], [place] [address]    Assessment:  CLEMENTINE SOULLIERE is a 27 y.o. 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  WBC 15,200 - 21, 600 without trend.  Differential is predominantly neutrophils.   She has iron deficiency.  MCV is low.  Ferritin is 17.  Etiology is unclear.  Diet appears good.  She denies pica.  She has regular menses with 2 days of heavy flow.  She describes intermittent rectal bleeding that she relates to hemorrhoids.  She has been on oral iron x 2 weeks.  Work-up on 06/05/2017 revealed a WBC of 13,300  (ANC 8200).  Hemoglobin 11.7, hematocrit 36.5, MCV 73.9, and platelets of 418,000.  Peripheral smear noted a mild leukocytosis with mainly neutrophils and  lymphocytes.  Morphology appeared normal.  Normal studies included: sed rate, CRP, ANA, RF, and BCR-ABL by FISH.  She has a significant family history of colon cancer (paternal grandfather and paternal great grandfather).  Maternal grandfather had lymphoma at age 32 then leukemia at age 4.  Symptomatically, she has continued fatigued. Bowels movements are more solid with the addition of oral iron BID.  She describes intermittent hemorrhoidal bleeding.  Knees and fingers ache.  Exam is unremarkable.   Plan: 1. Labs today:  CBC with diff, flow cytometry, B12, folate, TSH, UA. 2. Review labs from 06/05/2017.  Mild leukocytosis; WBC 13,300.  Pathology smear review showed mainly neutrophils and lymphocytes; morphology was normal.  Myeloproliferative disorder work-up negative. 3. Discuss microcytic hypochromic RBC indices consistent with iron deficiency.   Ferritin was 17.  Started oral iron x 1 month ago at this point; increased to BID x 2 weeks ago.  CBC demonstrates improvement. Hemoglobin is 12, hematocrit 37.8, MCV 74.6, and platelets  412,000. 4. Discuss concern for OSAH syndrome. Patient is overweight. She does not feel refreshed in the mornings after a full night of sleep. She is fatigued. Patient to discuss with PCP.  5. Refer to gastroenterology for evaluation of potential source of GI blood loss.  6. RTC in 2 weeks for MD assessment and review of workup.    Quentin Mulling, NP  06/19/17, 10:59 AM  I saw and evaluated the patient, participating in the key portions of the service and reviewing pertinent diagnostic studies and records.  I reviewed the nurse practitioner's note and agree with the findings and the plan.  The assessment and plan were discussed with the patient.  Several questions were asked by the patient and answered.   Nelva Nay, MD 06/19/17, 10:59 AM

## 2017-06-19 ENCOUNTER — Inpatient Hospital Stay: Payer: 59

## 2017-06-19 ENCOUNTER — Encounter: Payer: Self-pay | Admitting: Hematology and Oncology

## 2017-06-19 ENCOUNTER — Inpatient Hospital Stay (HOSPITAL_BASED_OUTPATIENT_CLINIC_OR_DEPARTMENT_OTHER): Payer: 59 | Admitting: Hematology and Oncology

## 2017-06-19 VITALS — BP 136/84 | HR 86 | Temp 98.3°F | Resp 18 | Ht 66.0 in | Wt 281.7 lb

## 2017-06-19 DIAGNOSIS — D72829 Elevated white blood cell count, unspecified: Secondary | ICD-10-CM | POA: Diagnosis not present

## 2017-06-19 DIAGNOSIS — D509 Iron deficiency anemia, unspecified: Secondary | ICD-10-CM | POA: Diagnosis not present

## 2017-06-19 DIAGNOSIS — I10 Essential (primary) hypertension: Secondary | ICD-10-CM | POA: Diagnosis not present

## 2017-06-19 LAB — URINALYSIS, COMPLETE (UACMP) WITH MICROSCOPIC
BACTERIA UA: NONE SEEN
Bilirubin Urine: NEGATIVE
Glucose, UA: NEGATIVE mg/dL
Ketones, ur: NEGATIVE mg/dL
Leukocytes, UA: NEGATIVE
NITRITE: NEGATIVE
PH: 8 (ref 5.0–8.0)
Protein, ur: NEGATIVE mg/dL
SPECIFIC GRAVITY, URINE: 1.01 (ref 1.005–1.030)

## 2017-06-19 LAB — CBC WITH DIFFERENTIAL/PLATELET
Basophils Absolute: 0.1 10*3/uL (ref 0–0.1)
Basophils Relative: 1 %
Eosinophils Absolute: 0.3 10*3/uL (ref 0–0.7)
Eosinophils Relative: 3 %
HEMATOCRIT: 37.8 % (ref 35.0–47.0)
HEMOGLOBIN: 12 g/dL (ref 12.0–16.0)
Lymphocytes Relative: 30 %
Lymphs Abs: 3.8 10*3/uL — ABNORMAL HIGH (ref 1.0–3.6)
MCH: 23.7 pg — AB (ref 26.0–34.0)
MCHC: 31.8 g/dL — AB (ref 32.0–36.0)
MCV: 74.6 fL — ABNORMAL LOW (ref 80.0–100.0)
MONO ABS: 0.5 10*3/uL (ref 0.2–0.9)
Monocytes Relative: 4 %
NEUTROS PCT: 62 %
Neutro Abs: 7.8 10*3/uL — ABNORMAL HIGH (ref 1.4–6.5)
Platelets: 412 10*3/uL (ref 150–440)
RBC: 5.06 MIL/uL (ref 3.80–5.20)
RDW: 17.8 % — AB (ref 11.5–14.5)
WBC: 12.5 10*3/uL — ABNORMAL HIGH (ref 3.6–11.0)

## 2017-06-19 LAB — TSH: TSH: 1.625 u[IU]/mL (ref 0.350–4.500)

## 2017-06-19 LAB — VITAMIN B12: VITAMIN B 12: 366 pg/mL (ref 180–914)

## 2017-06-19 LAB — FOLATE: FOLATE: 12.3 ng/mL (ref >5.9)

## 2017-06-19 NOTE — Progress Notes (Signed)
No new changes noted today 

## 2017-06-25 LAB — COMP PANEL: LEUKEMIA/LYMPHOMA

## 2017-07-03 ENCOUNTER — Inpatient Hospital Stay: Payer: 59 | Admitting: Hematology and Oncology

## 2017-07-03 DIAGNOSIS — R823 Hemoglobinuria: Secondary | ICD-10-CM | POA: Insufficient documentation

## 2017-07-03 DIAGNOSIS — R5383 Other fatigue: Secondary | ICD-10-CM | POA: Insufficient documentation

## 2017-07-03 NOTE — Progress Notes (Deleted)
[facility] day:  07/03/2017  Chief Complaint: Cheryl Pope is a 27 y.o. female with an elevated white blood cell count who is seen for review of additional work-up.  HPI:  The patient was last seen in the hematology clinic on 06/19/2017.  At that time, she noted ongoing fatigue. Bowels movements were more solid with the addition of oral iron BID.  She described intermittent hemorrhoidal bleeding.  Knees and fingers ached.  Exam was unremarkable.   CBC revealed a hematocrit of 37.8, hemoglobin 12.0, MCV 74.6, platelets 412,000, WBC 12,500 with an ANC of 7800.  ALC was 3800.  B12 was 366.  Folate was 12.3.  TSH was 1.625.  Flow cytometry revealed no significant immunophenotypic abnormality.  Urinalysis revealed moderate hemoglobin and 0-5 RBCs/HPF.  At last visit, we discussed follow-up with her PCP for sleep apnea testing.  She was referred to gastroenterology.  During the interim,    Past Medical History:  Diagnosis Date  . Anemia   . Chronic hypertension   . Depression   . Pregnancy induced hypertension     Past Surgical History:  Procedure Laterality Date  . TONSILLECTOMY    . TUBAL LIGATION Bilateral 04/06/2015   Procedure: POST PARTUM TUBAL LIGATION;  Surgeon: Gae Dry, MD;  Location: [facility] ORS;  Service: Gynecology;  Laterality: Bilateral;    Family History  Problem Relation Age of Onset  . Heart disease Father   . Kidney disease Paternal Uncle   . Skin cancer Mother   . Leukemia Maternal Grandfather   . Lung cancer Paternal Grandfather   . Colon cancer Paternal Grandfather   . Cancer Neg Hx     Social History:  reports that she has never smoked. She has never used smokeless tobacco. She reports that she does not drink alcohol or use drugs.  She has 2 children (age 49 and 41).  She is employed FT as a Production assistant, radio at [employer]. Patient denies known exposures to radiation or toxins. The patient is alone  today.  Allergies:  Allergies  Allergen Reactions  . Codeine Swelling    Current Medications: Current Outpatient Medications  Medication Sig Dispense Refill  . escitalopram (LEXAPRO) 10 MG tablet Take 10 mg by mouth daily.    . ferrous sulfate 325 (65 FE) MG tablet Take 325 mg by mouth daily with breakfast.    . traZODone (DESYREL) 50 MG tablet Take 50 mg by mouth at bedtime.     No current facility-administered medications for this visit.     Review of Systems  Constitutional: Positive for malaise/fatigue. Negative for diaphoresis, fever and weight loss (weight up 3 pounds).  HENT: Negative.  Negative for congestion, ear discharge, ear pain, hearing loss, nosebleeds, sinus pain, sore throat and tinnitus.   Eyes: Negative.  Negative for blurred vision, double vision and photophobia.  Respiratory: Negative.  Negative for cough, hemoptysis, sputum production and shortness of breath.        Denies sleep apnea  Cardiovascular: Negative.  Negative for chest pain, palpitations, orthopnea, leg swelling and PND.  Gastrointestinal: Positive for blood in stool (related to known hemorrhoids). Negative for abdominal pain, constipation, diarrhea, melena, nausea and vomiting.       Loose stools x 2-3 months; improved  Genitourinary: Negative.  Negative for dysuria, frequency, hematuria and urgency.       Heavy menses  Musculoskeletal: Positive for joint pain (polyarthralgia; mainly knees and hands). Negative for back pain, falls  and myalgias.  Skin: Negative.  Negative for itching and rash.  Neurological: Negative for dizziness, tremors, weakness and headaches.       Restless legs  Endo/Heme/Allergies: Negative.  Does not bruise/bleed easily.  Psychiatric/Behavioral: Negative.  Negative for depression, memory loss and suicidal ideas. The patient is not nervous/anxious and does not have insomnia.   All other systems reviewed and are negative.  Physical Exam: unknown if currently  breastfeeding. GENERAL:  Well developed, well nourished, heavyset woman sitting comfortably in the exam room in no acute distress. MENTAL STATUS:  Alert and oriented to person, place and time. HEAD:  Long brown hair.  Normocephalic, atraumatic, face symmetric, no Cushingoid features. EYES:  Brown eyes.  No conjunctivitis or scleral icterus. NEUROLOGICAL: Unremarkable. PSYCH:  Appropriate.   GENERAL:  Well developed, well nourished, woman sitting comfortably in the exam room in no acute distress. MENTAL STATUS:  Alert and oriented to person, place and time. HEAD:  *** hair.  Normocephalic, atraumatic, face symmetric, no Cushingoid features. EYES:  *** eyes.  Pupils equal round and reactive to light and accomodation.  No conjunctivitis or scleral icterus. ENT:  Oropharynx clear without lesion.  Tongue normal. Mucous membranes moist.  RESPIRATORY:  Clear to auscultation without rales, wheezes or rhonchi. CARDIOVASCULAR:  Regular rate and rhythm without murmur, rub or gallop. ABDOMEN:  Soft, non-tender, with active bowel sounds, and no hepatosplenomegaly.  No masses. SKIN:  No rashes, ulcers or lesions. EXTREMITIES: No edema, no skin discoloration or tenderness.  No palpable cords. LYMPH NODES: No palpable cervical, supraclavicular, axillary or inguinal adenopathy  NEUROLOGICAL: Unremarkable. PSYCH:  Appropriate.    No visits with results within 3 Day(s) from this visit.  Latest known visit with results is:  Appointment on 06/19/2017  Component Date Value Ref Range Status  . WBC 06/19/2017 12.5* 3.6 - 11.0 K/uL Final  . RBC 06/19/2017 5.06  3.80 - 5.20 MIL/uL Final  . Hemoglobin 06/19/2017 12.0  12.0 - 16.0 g/dL Final  . HCT 06/19/2017 37.8  35.0 - 47.0 % Final  . MCV 06/19/2017 74.6* 80.0 - 100.0 fL Final  . MCH 06/19/2017 23.7* 26.0 - 34.0 pg Final  . MCHC 06/19/2017 31.8* 32.0 - 36.0 g/dL Final  . RDW 06/19/2017 17.8* 11.5 - 14.5 % Final  . Platelets 06/19/2017 412  150 - 440 K/uL  Final  . Neutrophils Relative % 06/19/2017 62  % Final  . Neutro Abs 06/19/2017 7.8* 1.4 - 6.5 K/uL Final  . Lymphocytes Relative 06/19/2017 30  % Final  . Lymphs Abs 06/19/2017 3.8* 1.0 - 3.6 K/uL Final  . Monocytes Relative 06/19/2017 4  % Final  . Monocytes Absolute 06/19/2017 0.5  0.2 - 0.9 K/uL Final  . Eosinophils Relative 06/19/2017 3  % Final  . Eosinophils Absolute 06/19/2017 0.3  0 - 0.7 K/uL Final  . Basophils Relative 06/19/2017 1  % Final  . Basophils Absolute 06/19/2017 0.1  0 - 0.1 K/uL Final   Performed at [facility], [address], [place], [place] [address]  . Vitamin B-12 06/19/2017 366  180 - 914 pg/mL Final   Comment: (NOTE) This assay is not validated for testing neonatal or myeloproliferative syndrome specimens for Vitamin B12 levels. Performed at [facility], [place] [address], [place], [place] [address]   . Folate 06/19/2017 12.3  >5.9 ng/mL Final   Performed at [facility], [address] [place], [place] [address]  . TSH 06/19/2017 1.625  0.350 - 4.500 uIU/mL  Final   Comment: Performed by a 3rd Generation assay with a functional sensitivity of <=0.01 uIU/mL. Performed at [facility], [address], [place], [place] [address]   . PATH INTERP XXX-IMP 06/19/2017 Comment   Final   No significant immunophenotypic abnormality detected  . ANNOTATION COMMENT IMP 06/19/2017 Comment   Corrected   Comment: (NOTE) Recommend clinical correlation and cytogenetic/ Jak 2 testing follow up as appropriate.   Marland Kitchen CLINICAL INFO 06/19/2017 Comment   Corrected   Comment: (NOTE) Accompanying CBC dated 06/06/17 shows: WBC count 12.5, Neu 7.8, Lym 3.8, Mon 0.5, Plt 412K.   . Misc Source 06/19/2017 Comment   Final   Peripheral blood  . ASSESSMENT OF LEUKOCYTES 06/19/2017 Comment   Final   Comment: (NOTE) No monoclonal B cell population is detected. kappa:lambda ratio 1.4 An absolute increase in T-cells is detected. There is no loss of, or  aberrant expression of, the pan T cell antigens to suggest a neoplastic T cell process. CD4:CD8 ratio 1.8 No circulating blasts are detected. There is no immunophenotypic  evidence of abnormal myeloid maturation. Analysis of the leukocyte population shows: granulocytes 67%, monocytes 3%, lymphocytes 30%, blasts <0.5%, B cells 4%, T cells 25%, NK cells 1%.   . % Viable Cells 06/19/2017 Comment   Corrected   96%  . ANALYSIS AND GATING STRATEGY 06/19/2017 Comment   Final   8 color analysis with CD45/SSC  . IMMUNOPHENOTYPING STUDY 06/19/2017 Comment   Final   Comment: (NOTE) CD2       Normal         CD3       Normal CD4       Normal         CD5       Normal CD7       Normal         CD8       Normal CD10      Normal         CD11b     Normal CD13      Normal         CD14      Normal CD16      Normal         CD19      Normal CD20      Normal         CD33      Normal CD34      Normal         CD38      Normal CD45      Normal         CD56      Normal CD57      Normal         CD117     Normal HLA-DR    Normal         KAPPA     Normal LAMBDA    Normal         CD64      Normal   . PATHOLOGIST NAME 06/19/2017 Comment   Final   Henrietta Hoover, M.D.  . COMMENT: 06/19/2017 Comment   Corrected   Comment: (NOTE) Each antibody in this assay was utilized to assess for potential abnormalities of studied cell populations or to characterize identified abnormalities. This test was developed and its performance characteristics determined by [facility].  It has not been cleared or approved by the U.S. Food and Drug Administration. The FDA has determined that such clearance  or approval is not necessary. This test is used for clinical purposes.  It should not be regarded as investigational or for research. Performed At: -[facility] [address] [place], [place] [id number] Nechama Guard MD DJ:[id number] Performed At: [facility] [address] [place], [place] [id number] Nechama Guard MD TM:[id number] Performed at [facility], [address] [place], [place] [address]   . Color, Urine 06/19/2017 STRAW* YELLOW Final  . APPearance 06/19/2017 CLEAR* CLEAR Final  . Specific Gravity, Urine 06/19/2017 1.010  1.005 - 1.030 Final  . pH 06/19/2017 8.0  5.0 - 8.0 Final  . Glucose, UA 06/19/2017 NEGATIVE  NEGATIVE mg/dL Final  . Hgb urine dipstick 06/19/2017 MODERATE* NEGATIVE Final  . Bilirubin Urine 06/19/2017 NEGATIVE  NEGATIVE Final  . Ketones, ur 06/19/2017 NEGATIVE  NEGATIVE mg/dL Final  . Protein, ur 06/19/2017 NEGATIVE  NEGATIVE mg/dL Final  . Nitrite 06/19/2017 NEGATIVE  NEGATIVE Final  . Leukocytes, UA 06/19/2017 NEGATIVE  NEGATIVE Final  . RBC / HPF 06/19/2017 0-5  0 - 5 RBC/hpf Final  . WBC, UA 06/19/2017 0-5  0 - 5 WBC/hpf Final  . Bacteria, UA 06/19/2017 NONE SEEN  NONE SEEN Final  . Squamous Epithelial / LPF 06/19/2017 0-5  0 - 5 Final  . Mucus 06/19/2017 PRESENT   Final   Performed at [facility], [address] [place], [place] [address]    Assessment:  Cheryl Pope is a 27 y.o. 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  WBC 15,200 - 21, 600 without trend.  Differential is predominantly neutrophils.   She has iron deficiency.  MCV is low.  Ferritin is 17.  Etiology is unclear.  Diet appears good.  She denies pica.  She has regular menses with 2 days of heavy flow.  She describes intermittent rectal bleeding that she relates to hemorrhoids.  She has been on oral iron x 2 weeks.  Work-up on 06/05/2017 revealed a WBC of 13,300  ([place] 8200).  Hemoglobin 11.7, hematocrit 36.5, MCV 73.9, and platelets of 418,000.  Peripheral smear noted a mild leukocytosis with mainly neutrophils and lymphocytes.  Morphology appeared normal.  Normal studies included: sed rate, CRP, ANA, RF, and BCR-ABL by FISH.  Labs on 06/19/2017 revealed a normal B12, folate, TSH.  Flow cytometry revealed no significant immunophenotypic  abnormality.  Urinalysis revealed moderate hemoglobin and 0-5 RBCs/HPF.  She has a significant family history of colon cancer (paternal grandfather and paternal great grandfather).  Maternal grandfather had lymphoma at age 8 then leukemia at age 79.  Symptomatically, she has continued fatigued. Bowels movements are more solid with the addition of oral iron BID.  She describes intermittent hemorrhoidal bleeding.  Knees and fingers ache.  Exam is unremarkable.   Plan: 1. Review labs from 06/19/2017. 2.  3.  4. Labs today:  CBC with diff, flow cytometry, B12, folate, TSH, UA. 5. Review labs from 06/05/2017.  Mild leukocytosis; WBC 13,300.  Pathology smear review showed mainly neutrophils and lymphocytes; morphology was normal.  Myeloproliferative disorder work-up negative. 6. Discuss microcytic hypochromic RBC indices consistent with iron deficiency.   Ferritin was 17.  Started oral iron x 1 month ago at this point; increased to BID x 2 weeks ago.  CBC demonstrates improvement. Hemoglobin is 12, hematocrit 37.8, MCV 74.6, and platelets 412,000. 7. Discuss concern for OSAH syndrome. Patient is overweight. She does not feel refreshed in the mornings after a full night of sleep. She  is fatigued. Patient to discuss with PCP.  8. Refer to gastroenterology for evaluation of potential source of GI blood loss.  9. RTC in 2 weeks for MD assessment and review of workup.    Lequita Asal, MD  07/03/17, 6:04 AM  I saw and evaluated the patient, participating in the key portions of the service and reviewing pertinent diagnostic studies and records.  I reviewed the nurse practitioner's note and agree with the findings and the plan.  The assessment and plan were discussed with the patient.  Several questions were asked by the patient and answered.   Nolon Stalls, MD 07/03/17, 6:04 AM

## 2017-07-08 ENCOUNTER — Inpatient Hospital Stay: Payer: 59 | Attending: Hematology and Oncology | Admitting: Hematology and Oncology

## 2017-07-08 ENCOUNTER — Encounter: Payer: Self-pay | Admitting: Hematology and Oncology

## 2017-07-08 VITALS — BP 128/74 | HR 97 | Temp 98.2°F | Resp 18 | Ht 66.0 in | Wt 285.5 lb

## 2017-07-08 DIAGNOSIS — D72829 Elevated white blood cell count, unspecified: Secondary | ICD-10-CM

## 2017-07-08 DIAGNOSIS — I10 Essential (primary) hypertension: Secondary | ICD-10-CM

## 2017-07-08 DIAGNOSIS — D509 Iron deficiency anemia, unspecified: Secondary | ICD-10-CM | POA: Diagnosis not present

## 2017-07-08 NOTE — Progress Notes (Signed)
[facility] day:  07/08/2017  Chief Complaint: Cheryl Pope is a 27 y.o. female with an elevated white blood cell count who is seen for review of additional work-up.  HPI:  The patient was last seen in the hematology clinic on 06/19/2017.  At that time, she noted ongoing fatigue. Bowels movements were more solid with the addition of oral iron BID.  She described intermittent hemorrhoidal bleeding.  Knees and fingers ached.  Exam was unremarkable.   CBC revealed a hematocrit of 37.8, hemoglobin 12.0, MCV 74.6, platelets 412,000, WBC 12,500 with an ANC of 7800.  ALC was 3800.  B12 was 366.  Folate was 12.3.  TSH was 1.625.  Flow cytometry revealed no significant immunophenotypic abnormality.  Urinalysis revealed moderate hemoglobin and 0-5 RBCs/HPF.  At last visit, we discussed follow-up with her PCP for sleep apnea testing.  She was referred to gastroenterology.  During the interim, patient is doing well today. She does not express any acute concerns. Patient denies B symptoms. She has not experienced any significant interval infections. Patient notes that she is eating well. Weight has increased by 4 pounds.   Patient denies pain in the clinic today.    Past Medical History:  Diagnosis Date  . Anemia   . Chronic hypertension   . Depression   . Pregnancy induced hypertension     Past Surgical History:  Procedure Laterality Date  . TONSILLECTOMY    . TUBAL LIGATION Bilateral 04/06/2015   Procedure: POST PARTUM TUBAL LIGATION;  Surgeon: Gae Dry, MD;  Location: [facility] ORS;  Service: Gynecology;  Laterality: Bilateral;    Family History  Problem Relation Age of Onset  . Heart disease Father   . Kidney disease Paternal Uncle   . Skin cancer Mother   . Leukemia Maternal Grandfather   . Lung cancer Paternal Grandfather   . Colon cancer Paternal Grandfather   . Cancer Neg Hx     Social History:  reports that she has never smoked.  She has never used smokeless tobacco. She reports that she does not drink alcohol or use drugs.  She has 2 children (age 81 and 60).  She is employed FT as a Production assistant, radio at [employer]. Patient denies known exposures to radiation or toxins. The patient is alone today.  Allergies:  Allergies  Allergen Reactions  . Codeine Swelling    Current Medications: Current Outpatient Medications  Medication Sig Dispense Refill  . ferrous sulfate 325 (65 FE) MG tablet Take 325 mg by mouth daily with breakfast.    . traZODone (DESYREL) 50 MG tablet Take 50 mg by mouth at bedtime.    Marland Kitchen escitalopram (LEXAPRO) 10 MG tablet Take 10 mg by mouth daily.     No current facility-administered medications for this visit.     Review of Systems  Constitutional: Positive for malaise/fatigue. Negative for chills, diaphoresis, fever and weight loss (weight up 4 pounds).  HENT: Negative.  Negative for congestion, ear discharge, ear pain, hearing loss, nosebleeds, sinus pain, sore throat and tinnitus.   Eyes: Negative.  Negative for blurred vision, double vision, photophobia, pain, discharge and redness.  Respiratory: Positive for cough. Negative for hemoptysis, sputum production, shortness of breath and stridor.        Denies sleep apnea  Cardiovascular: Negative.  Negative for chest pain, orthopnea, leg swelling and PND.  Gastrointestinal: Positive for blood in stool (related to known hemorrhoids). Negative for abdominal pain, constipation, diarrhea, melena,  nausea and vomiting.       Loose stools x 2-3 months; improved  Genitourinary: Negative.  Negative for dysuria, frequency, hematuria and urgency.       Heavy menses  Musculoskeletal: Positive for joint pain (polyarthralgia; mainly knees and hands). Negative for myalgias and neck pain.  Skin: Negative.  Negative for itching and rash.  Neurological: Negative for dizziness, tremors, focal weakness, weakness and headaches.       Restless legs   Endo/Heme/Allergies: Negative.  Does not bruise/bleed easily.  Psychiatric/Behavioral: Negative.  Negative for depression and memory loss. The patient is not nervous/anxious and does not have insomnia.   All other systems reviewed and are negative.  Physical Exam: Blood pressure 128/74, pulse 97, temperature 98.2 F (36.8 C), temperature source Tympanic, resp. rate 18, height _0  (1.676 m), weight 285 lb 8 oz (129.5 kg), SpO2 98 %, unknown if currently breastfeeding. GENERAL:  Well developed, well nourished, woman sitting comfortably in the exam room in no acute distress. MENTAL STATUS:  Alert and oriented to person, place and time. HEAD:  Long brown hair.  Normocephalic, atraumatic, face symmetric, no Cushingoid features. EYES:  Brown eyes.  No conjunctivitis or scleral icterus. NEUROLOGICAL: Unremarkable. PSYCH:  Appropriate.    No visits with results within 3 Day(s) from this visit.  Latest known visit with results is:  Appointment on 06/19/2017  Component Date Value Ref Range Status  . WBC 06/19/2017 12.5* 3.6 - 11.0 K/uL Final  . RBC 06/19/2017 5.06  3.80 - 5.20 MIL/uL Final  . Hemoglobin 06/19/2017 12.0  12.0 - 16.0 g/dL Final  . HCT 06/19/2017 37.8  35.0 - 47.0 % Final  . MCV 06/19/2017 74.6* 80.0 - 100.0 fL Final  . MCH 06/19/2017 23.7* 26.0 - 34.0 pg Final  . MCHC 06/19/2017 31.8* 32.0 - 36.0 g/dL Final  . RDW 06/19/2017 17.8* 11.5 - 14.5 % Final  . Platelets 06/19/2017 412  150 - 440 K/uL Final  . Neutrophils Relative % 06/19/2017 62  % Final  . Neutro Abs 06/19/2017 7.8* 1.4 - 6.5 K/uL Final  . Lymphocytes Relative 06/19/2017 30  % Final  . Lymphs Abs 06/19/2017 3.8* 1.0 - 3.6 K/uL Final  . Monocytes Relative 06/19/2017 4  % Final  . Monocytes Absolute 06/19/2017 0.5  0.2 - 0.9 K/uL Final  . Eosinophils Relative 06/19/2017 3  % Final  . Eosinophils Absolute 06/19/2017 0.3  0 - 0.7 K/uL Final  . Basophils Relative 06/19/2017 1  % Final  . Basophils Absolute  06/19/2017 0.1  0 - 0.1 K/uL Final   Performed at [facility], [address], [place], [place] [address]  . Vitamin B-12 06/19/2017 366  180 - 914 pg/mL Final   Comment: (NOTE) This assay is not validated for testing neonatal or myeloproliferative syndrome specimens for Vitamin B12 levels. Performed at [facility], [place] [address], [place], [place] [address]   . Folate 06/19/2017 12.3  >5.9 ng/mL Final   Performed at [facility], [address] [place], [place] [address]  . TSH 06/19/2017 1.625  0.350 - 4.500 uIU/mL Final   Comment: Performed by a 3rd Generation assay with a functional sensitivity of <=0.01 uIU/mL. Performed at [facility], [address], [place]  [address]   . PATH INTERP XXX-IMP 06/19/2017 Comment   Final   No significant immunophenotypic abnormality detected  . ANNOTATION COMMENT IMP 06/19/2017 Comment   Corrected   Comment: (NOTE) Recommend clinical correlation and cytogenetic/  Jak 2 testing follow up as appropriate.   Marland Kitchen CLINICAL INFO 06/19/2017 Comment   Corrected   Comment: (NOTE) Accompanying CBC dated 06/06/17 shows: WBC count 12.5, Neu 7.8, Lym 3.8, Mon 0.5, Plt 412K.   . Misc Source 06/19/2017 Comment   Final   Peripheral blood  . ASSESSMENT OF LEUKOCYTES 06/19/2017 Comment   Final   Comment: (NOTE) No monoclonal B cell population is detected. kappa:lambda ratio 1.4 An absolute increase in T-cells is detected. There is no loss of, or aberrant expression of, the pan T cell antigens to suggest a neoplastic T cell process. CD4:CD8 ratio 1.8 No circulating blasts are detected. There is no immunophenotypic  evidence of abnormal myeloid maturation. Analysis of the leukocyte population shows: granulocytes 67%, monocytes 3%, lymphocytes 30%, blasts <0.5%, B cells 4%, T cells 25%, NK cells 1%.   . % Viable Cells 06/19/2017 Comment   Corrected   96%  . ANALYSIS AND GATING STRATEGY 06/19/2017 Comment   Final    8 color analysis with CD45/SSC  . IMMUNOPHENOTYPING STUDY 06/19/2017 Comment   Final   Comment: (NOTE) CD2       Normal         CD3       Normal CD4       Normal         CD5       Normal CD7       Normal         CD8       Normal CD10      Normal         CD11b     Normal CD13      Normal         CD14      Normal CD16      Normal         CD19      Normal CD20      Normal         CD33      Normal CD34      Normal         CD38      Normal CD45      Normal         CD56      Normal CD57      Normal         CD117     Normal HLA-DR    Normal         KAPPA     Normal LAMBDA    Normal         CD64      Normal   . PATHOLOGIST NAME 06/19/2017 Comment   Final   Henrietta Hoover, M.D.  . COMMENT: 06/19/2017 Comment   Corrected   Comment: (NOTE) Each antibody in this assay was utilized to assess for potential abnormalities of studied cell populations or to characterize identified abnormalities. This test was developed and its performance characteristics determined by [facility].  It has not been cleared or approved by the U.S. Food and Drug Administration. The FDA has determined that such clearance or approval is not necessary. This test is used for clinical purposes.  It should not be regarded as investigational or for research. Performed At: -[facility] [address] [place], [place] [id number] Nechama Guard MD WI:[id number] Performed At: [facility] [address] [place], [place] [id number] Nechama Guard MD EH:[id number] Performed at [facility], [place],  [place], [place] [address]   . Color, Urine 06/19/2017 STRAW* YELLOW Final  . APPearance 06/19/2017 CLEAR* CLEAR Final  . Specific Gravity, Urine 06/19/2017 1.010  1.005 - 1.030 Final  . pH 06/19/2017 8.0  5.0 - 8.0 Final  . Glucose, UA 06/19/2017 NEGATIVE  NEGATIVE mg/dL Final  . Hgb urine dipstick 06/19/2017 MODERATE* NEGATIVE Final  . Bilirubin Urine 06/19/2017 NEGATIVE  NEGATIVE Final  .  Ketones, ur 06/19/2017 NEGATIVE  NEGATIVE mg/dL Final  . Protein, ur 06/19/2017 NEGATIVE  NEGATIVE mg/dL Final  . Nitrite 06/19/2017 NEGATIVE  NEGATIVE Final  . Leukocytes, UA 06/19/2017 NEGATIVE  NEGATIVE Final  . RBC / HPF 06/19/2017 0-5  0 - 5 RBC/hpf Final  . WBC, UA 06/19/2017 0-5  0 - 5 WBC/hpf Final  . Bacteria, UA 06/19/2017 NONE SEEN  NONE SEEN Final  . Squamous Epithelial / LPF 06/19/2017 0-5  0 - 5 Final  . Mucus 06/19/2017 PRESENT   Final   Performed at [facility], [address] [place], [place] [address]    Assessment:  EMMALINE WAHBA is a 27 y.o. 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  WBC 15,200 - 21, 600 without trend.  Differential is predominantly neutrophils.   She has iron deficiency.  MCV is low.  Ferritin is 17.  Etiology is unclear.  Diet appears good.  She denies pica.  She has regular menses with 2 days of heavy flow.  She describes intermittent rectal bleeding that she relates to hemorrhoids.  She has been on oral iron x 2 weeks.  Work-up on 06/05/2017 revealed a WBC of 13,300 ([address]).  Hemoglobin 11.7, hematocrit 36.5, MCV 73.9, and platelets of 418,000.  Peripheral smear noted a mild leukocytosis with mainly neutrophils and lymphocytes.  Morphology appeared normal.  Normal studies included: sed rate, CRP, ANA, RF, and BCR-ABL by FISH.  Labs on 06/19/2017 revealed a normal B12, folate, TSH.  Flow cytometry revealed no significant immunophenotypic abnormality.  Urinalysis revealed moderate hemoglobin and 0-5 RBCs/HPF.  She has a significant family history of colon cancer (paternal grandfather and paternal great grandfather).  Maternal grandfather had lymphoma at age 54 then leukemia at age 33.  Symptomatically, she is fatigued. Bowels movements are more solid with the addition of oral iron BID.  She describes intermittent hemorrhoidal bleeding.  Knees and fingers ache.  Exam is unremarkable.   Plan: 1. Review labs from  06/19/2017.  No evidence of leukemia.  Additional labs normal. 2. Discuss ongoing fatigue and concern for OSAH syndrome. She does not feel refreshed in the mornings after a full night of sleep. Patient to discuss with Dr Ginette Pitman.  3. Refer to gastroenterology for evaluation of potential source of GI blood loss.  4. Discuss follow-up with Dr. Ginette Pitman re: hemoglobin in urine. Ensure collection not affected by menses. 5. RTC PRN.   Lequita Asal, MD  07/08/17, 4:35 PM

## 2017-07-08 NOTE — Progress Notes (Signed)
No new changes noted today 

## 2017-08-13 IMAGING — US US RENAL
1 series · 14 of 25 positions shown · non-contrast
Comparison: None.

CLINICAL DATA: Pyelonephritis.  Thirty-three weeks pregnant

EXAM:
RENAL / URINARY TRACT ULTRASOUND COMPLETE

[Series 1: us renal · 0.24mm/px · 14 of 29 slices shown]
[im 1/29]
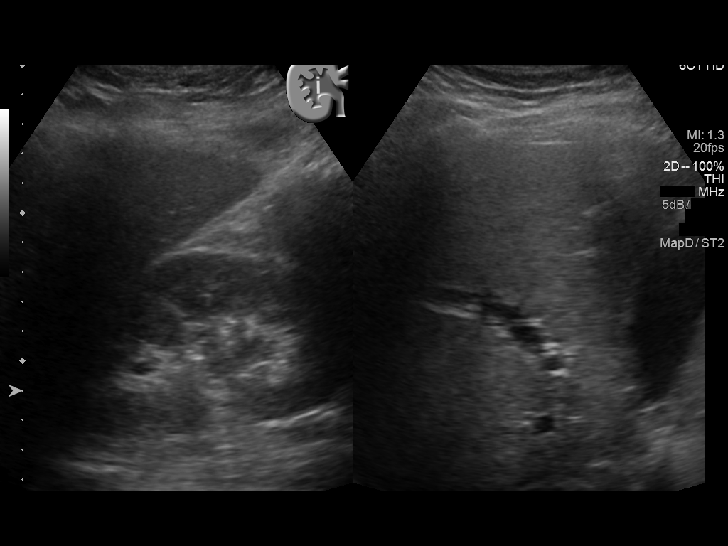
[im 3/29]
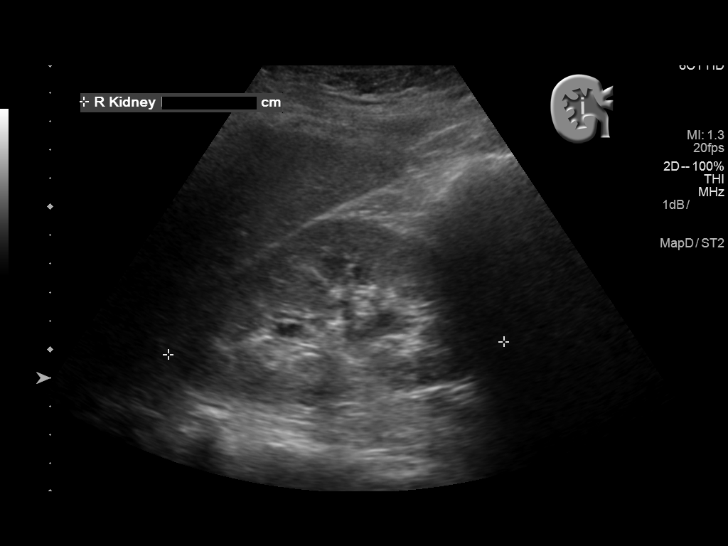
[im 5/29]
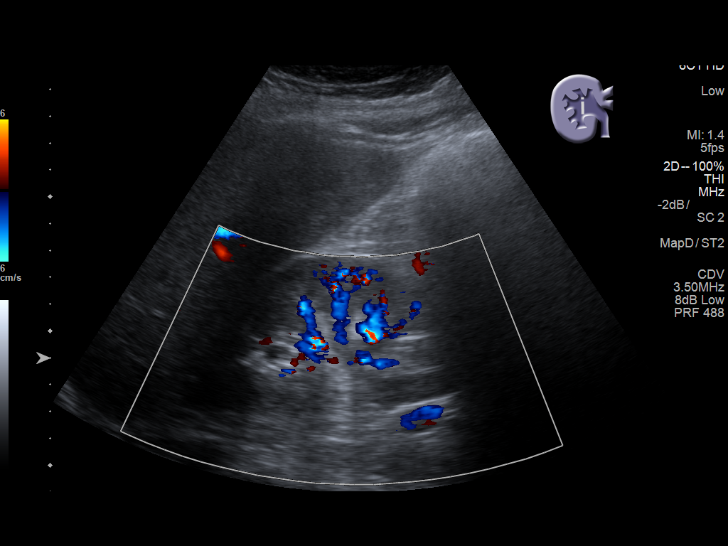
[im 8/29]
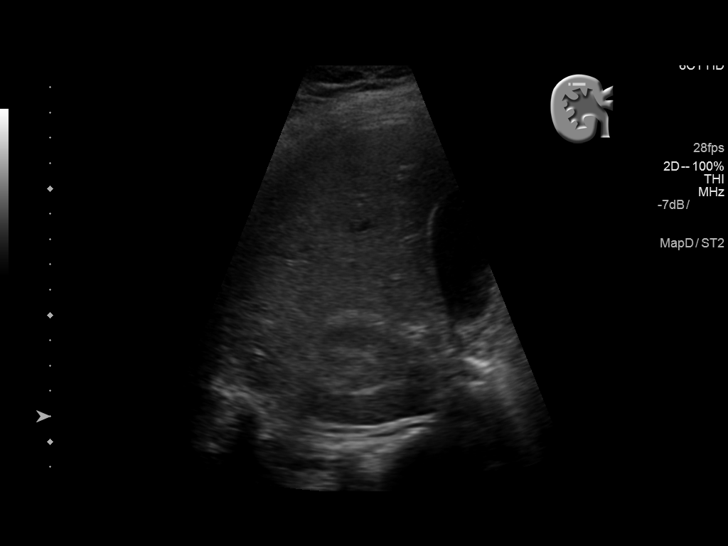
[im 10/29]
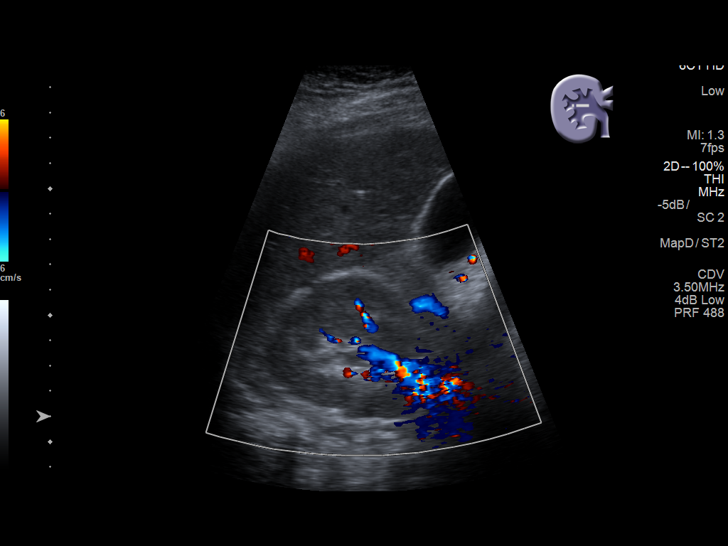
[im 11/29]
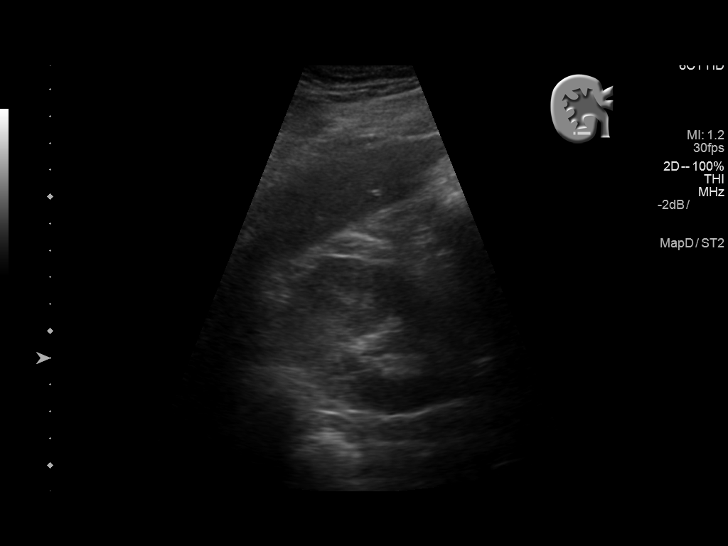
[im 13/29]
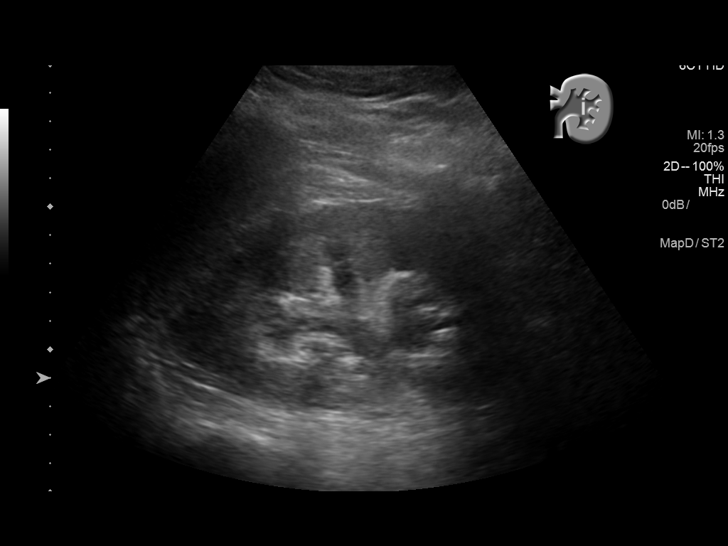
[im 16/29]
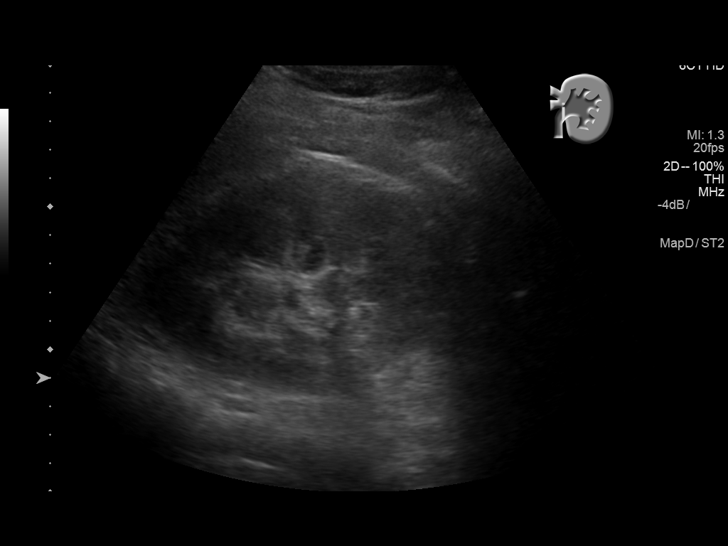
[im 18/29]
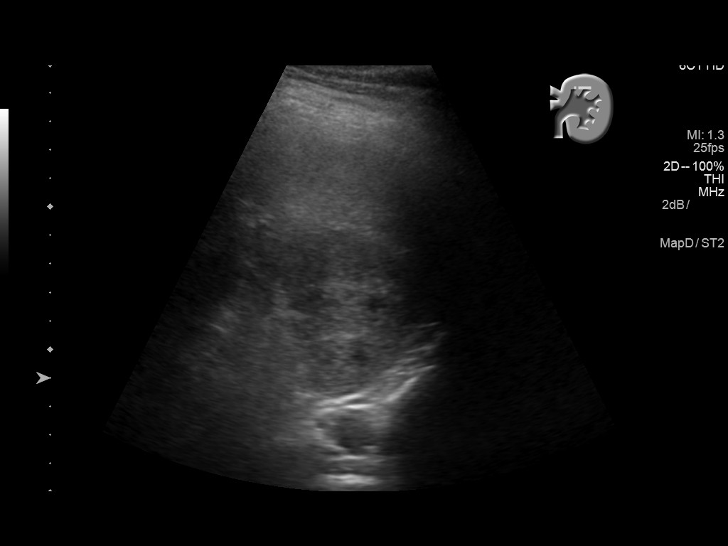
[im 19/29]
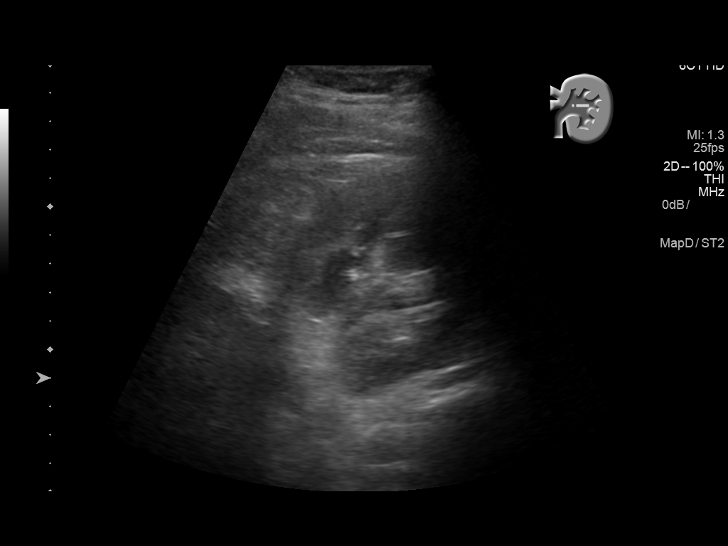
[im 22/29]
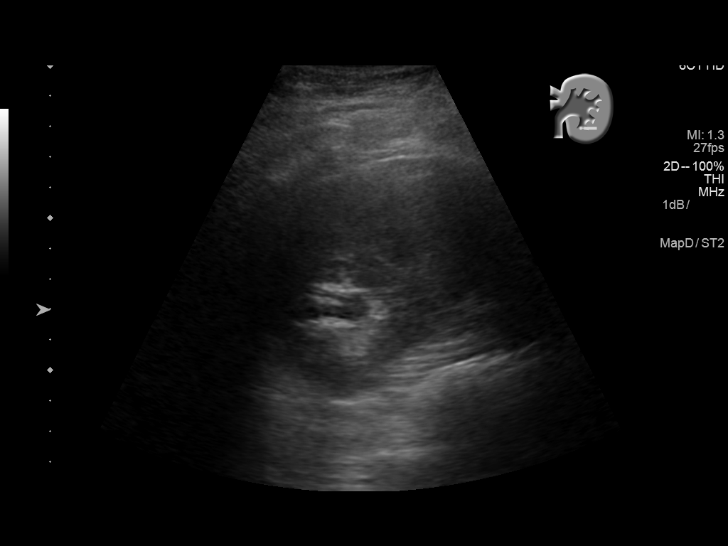
[im 24/29]
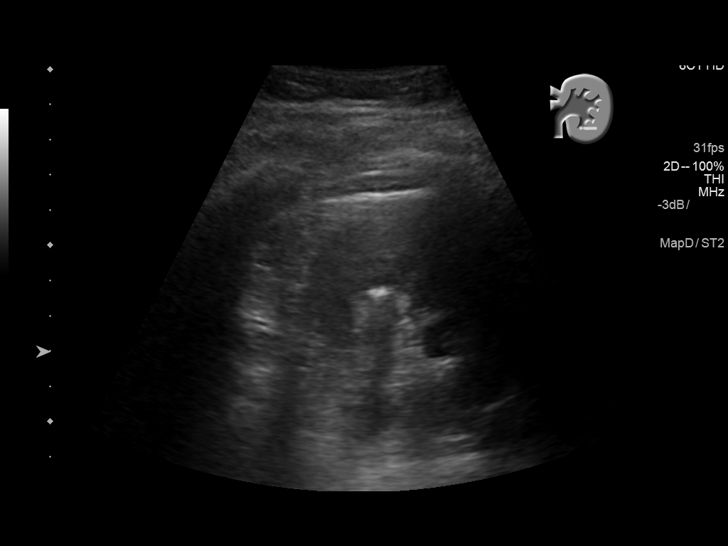
[im 26/29]
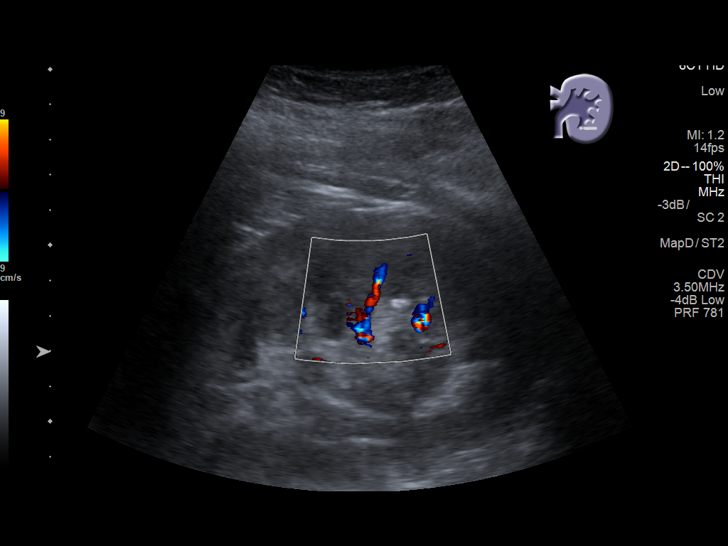
[im 29/29]
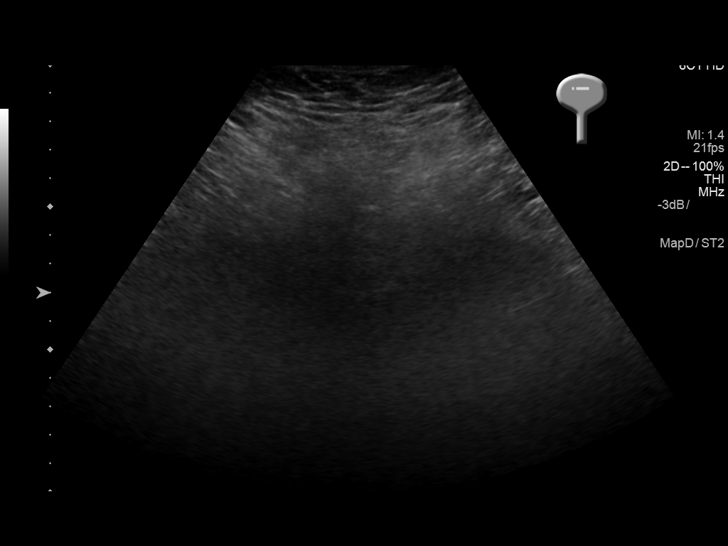

[14 of 25 positions shown; findings below may reference images not displayed]

FINDINGS: Right Kidney:

Length: 11.8 cm. Echogenicity within normal limits. No mass or
hydronephrosis visualized.

Left Kidney:

Length: 13.3 cm. Mild left hydronephrosis. Small nonobstructing
stone in the lower pole measuring 6 mm. Normal echotexture. No mass
lesion.

Bladder:

Decompressed, unable to visualize.
IMPRESSION: Mild left hydronephrosis.

6 mm nonobstructing left lower pole renal stone.

## 2017-08-18 ENCOUNTER — Encounter: Payer: Self-pay | Admitting: *Deleted

## 2017-08-18 ENCOUNTER — Ambulatory Visit: Payer: 59 | Admitting: Gastroenterology

## 2018-02-12 DIAGNOSIS — J40 Bronchitis, not specified as acute or chronic: Secondary | ICD-10-CM | POA: Diagnosis not present

## 2019-07-09 ENCOUNTER — Ambulatory Visit: Payer: 59 | Admitting: Hematology and Oncology

## 2019-07-14 NOTE — Progress Notes (Signed)
[facility]  [address] [place], [place] [address] Phone: [phone]  Fax: [phone]   Clinic Day:  07/15/2019  Referring physician: Tracie Harrier, MD  Chief Complaint: Cheryl Pope is a 29 y.o. female with an elevated white blood cell count who is seen for 2 year reassessment.  HPI: The patient was last seen in the hematology clinic on 07/08/2017. At that time, she was fatigued. Bowels movements were more solid with the addition of oral iron BID.  She described intermittent hemorrhoidal bleeding. Knees and fingers were aching.  Exam was unremarkable. Labs were normal. I discussed a referral to GI for evaluation of potential source of GI bleed. I discussed a follow up with Dr. Ginette Pitman. I encouraged the patient to follow up as needed.   She was last seen by Dr. Ginette Pitman on 06/29/2019 for annual physical.  She felt tired easily.  She was sleeping poorly.  Lasb were drawn.  She was referred to GI for screening colonoscopy.  Work-up on 06/29/2019 revealed a hematocrit 38.2, hemoglobin 11.6, MCV 80.9, platelets 399,000, WBC 16,500 (ANC 10,920; ALC 4450). Ferritin was 20. CMP was normal. Vitamin B12 was 362. TSH was 1.845. Urinalysis showed small amounts of leukocytes, WBC 4-10, there was a few bacteria and moderate squamous epithelial cells.  Urine culture showed mixed urogenital flora. Wet Prep revealed a few WBC and many vaginal bacteria. Lipid panel was normal.   During the interim, she has been feeling run down. The patient feels like she tires easily. She reports her white count was off again after having recent labs drawn. The patient never followed up with GI. She has noticed random bruising on her bilateral lower and upper extremities. She has been sleeping well. She is unsure if she has sleep apnea. She does not snore. I suggested she have a sleep study. Patient agreed.   She denies any infections. Her cough has resolved. She has occasional  blood in her stool. Her menstrual cycles last 4-5 days. She has 1-2 days of heavy flow and she does not take a lot of ibuprofen. She has no recent abdominal imaging. The patient received her covid vaccine in 03/2019 and 04/2019.    Past Medical History:  Diagnosis Date  . Anemia   . Chronic hypertension   . Depression   . Pregnancy induced hypertension     Past Surgical History:  Procedure Laterality Date  . TONSILLECTOMY    . TUBAL LIGATION Bilateral 04/06/2015   Procedure: POST PARTUM TUBAL LIGATION;  Surgeon: Gae Dry, MD;  Location: [facility] ORS;  Service: Gynecology;  Laterality: Bilateral;    Family History  Problem Relation Age of Onset  . Heart disease Father   . Kidney disease Paternal Uncle   . Skin cancer Mother   . Leukemia Maternal Grandfather   . Lung cancer Paternal Grandfather   . Colon cancer Paternal Grandfather   . Cancer Neg Hx     Social History:  reports that she has never smoked. She has never used smokeless tobacco. She reports that she does not drink alcohol and does not use drugs. she is fatigued. Bowels movements are more solid with the addition of oral iron BID.  She describes intermittent hemorrhoidal bleeding.  Knees and fingers ache.  Exam is unremarkable.  The patient is alone today.  Allergies:  Allergies  Allergen Reactions  . Codeine Swelling    Current Medications: Current Outpatient Medications  Medication Sig Dispense Refill  . buPROPion [facility]  XL) 150 MG 24 hr tablet Take 150 mg by mouth at bedtime.    . ferrous sulfate 325 (65 FE) MG tablet Take 325 mg by mouth daily with breakfast.     No current facility-administered medications for this visit.    Review of Systems  Constitutional: Positive for malaise/fatigue. Negative for chills, diaphoresis, fever and weight loss.       Doing good.  HENT: Negative for congestion, ear discharge, ear pain, hearing loss, nosebleeds, sinus pain, sore throat and tinnitus.   Eyes: Negative  for blurred vision.  Respiratory: Negative for cough (resolved), hemoptysis, sputum production and shortness of breath.        No sleep apnea.  Cardiovascular: Negative for chest pain, palpitations and leg swelling.  Gastrointestinal: Negative for abdominal pain, blood in stool (related to known hemorrhoids; occasional), constipation, diarrhea, heartburn, melena, nausea and vomiting.       Loose stools x 2-3 months; improved.  Genitourinary: Negative for dysuria, frequency, hematuria and urgency.       Heavy menses.  Musculoskeletal: Positive for joint pain (polyarthralgia; mainly knees and hands). Negative for back pain, myalgias and neck pain.  Skin: Negative for itching and rash.  Neurological: Negative for dizziness, tingling, sensory change, weakness and headaches.       Restless legs.  Endo/Heme/Allergies: Bruises/bleeds easily (bilateral upper/lower extremity).  Psychiatric/Behavioral: Negative for depression and memory loss. The patient is not nervous/anxious and does not have insomnia.   All other systems reviewed and are negative.  Performance status (ECOG): 1  Vitals Blood pressure 133/85, pulse 77, temperature (!) 96.4 F (35.8 C), temperature source Tympanic, weight 295 lb 3.1 oz (133.9 kg), SpO2 99 %, unknown if currently breastfeeding.   Physical Exam Vitals and nursing note reviewed.  Constitutional:      General: She is not in acute distress.    Appearance: She is well-developed and well-nourished. She is not diaphoretic.  HENT:     Head: Normocephalic and atraumatic.     Mouth/Throat:     Pharynx: No oropharyngeal exudate.      Comments: Long brown hair. Mask. Eyes:     General: No scleral icterus.    Extraocular Movements: EOM normal.     Conjunctiva/sclera: Conjunctivae normal.     Pupils: Pupils are equal, round, and reactive to light.     Comments: Brown eyes.  Cardiovascular:     Rate and Rhythm: Normal rate and regular rhythm.     Heart sounds: Normal  heart sounds. No murmur heard.   Pulmonary:     Effort: Pulmonary effort is normal. No respiratory distress.     Breath sounds: Normal breath sounds. No wheezing or rales.  Chest:     Chest wall: No tenderness.  Abdominal:     General: Bowel sounds are normal. There is no distension.     Palpations: Abdomen is soft. There is no splenomegaly or mass.     Tenderness: There is no abdominal tenderness. There is no guarding or rebound.  Musculoskeletal:        General: No tenderness or edema. Normal range of motion.     Cervical back: Normal range of motion and neck supple.  Lymphadenopathy:     Head:     Right side of head: No preauricular, posterior auricular or occipital adenopathy.     Left side of head: No preauricular, posterior auricular or occipital adenopathy.     Cervical: No cervical adenopathy.     Upper Body:  No axillary  adenopathy present.    Right upper body: No supraclavicular adenopathy.     Left upper body: No supraclavicular adenopathy.     Lower Body: No right inguinal adenopathy. No left inguinal adenopathy.  Skin:    General: Skin is warm and dry.  Neurological:     Mental Status: She is alert and oriented to person, place, and time.  Psychiatric:        Mood and Affect: Mood and affect normal.        Behavior: Behavior normal.        Thought Content: Thought content normal.        Judgment: Judgment normal.    No visits with results within 3 Day(s) from this visit.  Latest known visit with results is:  Appointment on 06/19/2017  Component Date Value Ref Range Status  . WBC 06/19/2017 12.5* 3.6 - 11.0 K/uL Final  . RBC 06/19/2017 5.06  3.80 - 5.20 MIL/uL Final  . Hemoglobin 06/19/2017 12.0  12.0 - 16.0 g/dL Final  . HCT 06/19/2017 37.8  35 - 47 % Final  . MCV 06/19/2017 74.6* 80.0 - 100.0 fL Final  . MCH 06/19/2017 23.7* 26.0 - 34.0 pg Final  . MCHC 06/19/2017 31.8* 32.0 - 36.0 g/dL Final  . RDW 06/19/2017 17.8* 11.5 - 14.5 % Final  . Platelets  06/19/2017 412  150 - 440 K/uL Final  . Neutrophils Relative % 06/19/2017 62  % Final  . Neutro Abs 06/19/2017 7.8* 1.4 - 6.5 K/uL Final  . Lymphocytes Relative 06/19/2017 30  % Final  . Lymphs Abs 06/19/2017 3.8* 1.0 - 3.6 K/uL Final  . Monocytes Relative 06/19/2017 4  % Final  . Monocytes Absolute 06/19/2017 0.5  0 - 0 K/uL Final  . Eosinophils Relative 06/19/2017 3  % Final  . Eosinophils Absolute 06/19/2017 0.3  0 - 0 K/uL Final  . Basophils Relative 06/19/2017 1  % Final  . Basophils Absolute 06/19/2017 0.1  0 - 0 K/uL Final   Performed at [facility], [address], [place], [place] [address]  . Vitamin B-12 06/19/2017 366  180 - 914 pg/mL Final   Comment: (NOTE) This assay is not validated for testing neonatal or myeloproliferative syndrome specimens for Vitamin B12 levels. Performed at [facility], [place] [address] [place], [place] [address]   . Folate 06/19/2017 12.3  >5.9 ng/mL Final   Performed at [facility], [address] [place], [place] [address]  . TSH 06/19/2017 1.625  0.350 - 4.500 uIU/mL Final   Comment: Performed by a 3rd Generation assay with a functional sensitivity of <=0.01 uIU/mL. Performed at [facility], [address] [place], [place] [address]   . PATH INTERP XXX-IMP 06/19/2017 Comment   Final   No significant immunophenotypic abnormality detected  . ANNOTATION COMMENT IMP 06/19/2017 Comment   Corrected   Comment: (NOTE) Recommend clinical correlation and cytogenetic/ Jak 2 testing follow up as appropriate.   Marland Kitchen CLINICAL INFO 06/19/2017 Comment   Corrected   Comment: (NOTE) Accompanying CBC dated 06/06/17 shows: WBC count 12.5, Neu 7.8, Lym 3.8, Mon 0.5, Plt 412K.   Marland Kitchen Specimen Type 06/19/2017 Comment   Final   Peripheral blood  . ASSESSMENT OF LEUKOCYTES 06/19/2017 Comment   Final   Comment: (NOTE) No monoclonal B cell population is detected. kappa:lambda ratio 1.4 An absolute increase in T-cells is  detected. There is no loss of, or aberrant expression of, the pan T cell antigens to suggest  a neoplastic T cell process. CD4:CD8 ratio 1.8 No circulating blasts are detected. There is no immunophenotypic  evidence of abnormal myeloid maturation. Analysis of the leukocyte population shows: granulocytes 67%, monocytes 3%, lymphocytes 30%, blasts <0.5%, B cells 4%, T cells 25%, NK cells 1%.   . % Viable Cells 06/19/2017 Comment   Corrected   96%  . ANALYSIS AND GATING STRATEGY 06/19/2017 Comment   Final   8 color analysis with CD45/SSC  . IMMUNOPHENOTYPING STUDY 06/19/2017 Comment   Final   Comment: (NOTE) CD2       Normal         CD3       Normal CD4       Normal         CD5       Normal CD7       Normal         CD8       Normal CD10      Normal         CD11b     Normal CD13      Normal         CD14      Normal CD16      Normal         CD19      Normal CD20      Normal         CD33      Normal CD34      Normal         CD38      Normal CD45      Normal         CD56      Normal CD57      Normal         CD117     Normal HLA-DR    Normal         KAPPA     Normal LAMBDA    Normal         CD64      Normal   . PATHOLOGIST NAME 06/19/2017 Comment   Final   Henrietta Hoover, M.D.  . COMMENT: 06/19/2017 Comment   Corrected   Comment: (NOTE) Each antibody in this assay was utilized to assess for potential abnormalities of studied cell populations or to characterize identified abnormalities. This test was developed and its performance characteristics determined by [facility].  It has not been cleared or approved by the U.S. Food and Drug Administration. The FDA has determined that such clearance or approval is not necessary. This test is used for clinical purposes.  It should not be regarded as investigational or for research. Performed At: -[facility] [address] [place], [place] [id number] Nechama Guard MD EQ:[id number] Performed At: [facility] [address] [place], [place] [id number] Nechama Guard MD ER:[id number] Performed at [facility], [address] [place], [place] [address]   . Color, Urine 06/19/2017 STRAW* YELLOW Final  . APPearance 06/19/2017 CLEAR* CLEAR Final  . Specific Gravity, Urine 06/19/2017 1.010  1.005 - 1.030 Final  . pH 06/19/2017 8.0  5.0 - 8.0 Final  . Glucose, UA 06/19/2017 NEGATIVE  NEGATIVE mg/dL Final  . Hgb urine dipstick 06/19/2017 MODERATE* NEGATIVE Final  . Bilirubin Urine 06/19/2017 NEGATIVE  NEGATIVE Final  . Ketones, ur 06/19/2017 NEGATIVE  NEGATIVE mg/dL Final  . Protein, ur 06/19/2017 NEGATIVE  NEGATIVE mg/dL Final  . Nitrite 06/19/2017 NEGATIVE  NEGATIVE Final  .  Leukocytes, UA 06/19/2017 NEGATIVE  NEGATIVE Final  . RBC / HPF 06/19/2017 0-5  0 - 5 RBC/hpf Final  . WBC, UA 06/19/2017 0-5  0 - 5 WBC/hpf Final  . Bacteria, UA 06/19/2017 NONE SEEN  NONE SEEN Final  . Squamous Epithelial / LPF 06/19/2017 0-5  0 - 5 Final  . Mucus 06/19/2017 PRESENT   Final   Performed at [facility], [address] [place], [place] [address]    Assessment:  Cheryl Pope is a 29 y.o. 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  WBC 15,200 - 21, 600 without trend.  Differential is predominantly neutrophils.   She has iron deficiency.  MCV is low.  Ferritin is 17.  Etiology is unclear.  Diet appears good.  She denies pica.  She has regular menses with 2 days of heavy flow.  She describes intermittent rectal bleeding that she relates to hemorrhoids.  She has been on oral iron x 2 weeks.  Work-up on 06/05/2017 revealed a WBC of 13,300 ([place] [address]).  Hemoglobin 11.7, hematocrit 36.5, MCV 73.9, and platelets of 418,000.  Peripheral smear noted a mild leukocytosis with mainly neutrophils and lymphocytes.  Morphology appeared normal.  Normal studies included: sed rate, CRP, ANA, RF, and BCR-ABL by FISH.  Labs on 06/19/2017 revealed a normal B12, folate, TSH.  Flow cytometry revealed  no significant immunophenotypic abnormality.  Urinalysis revealed moderate hemoglobin and 0-5 RBCs/HPF.  Work-up on 06/29/2019 revealed a hematocrit 38.2, hemoglobin 11.6, MCV 80.9, platelets 399,000, WBC 16,500 (ANC 10,920; ALC 4450). Ferritin was 20. CMP was normal. Vitamin B12 was 362. TSH was 1.845.   She has a significant family history of colon cancer (paternal grandfather and paternal great grandfather).  Maternal grandfather had lymphoma at age 66 then leukemia at age 22.  The patient received her COVID-19 vaccine in 03/2019 and 04/2019.   Symptomatically, she has been feeling run down. She has noticed random bruising on her bilateral lower and upper extremities. She denies any infections. She has occasional blood in her stool. Her menstrual cycles last 4-5 days. She has 1-2 days of heavy flow and she does not take a lot of ibuprofen.  WBC is 11,100.  Plan: 1.   Labs today:  CBC with diff, sed rate, CRP, BCR-ABL, flow cytometry. 2.   Mild leukocytosis  Patient has had mild leukocytosis since 08/2009.  Review prior work-up.  She appeared to have a reactive leukocytosis at initial evaluation.  Leukocytosis has persisted.  Discuss additional labs today.  3.   Possible sleep apnea  Patient wishes to undergo overnight oximetry.  Follow-up with primary care physician. 4.   RTC in 1 week for MD assessment and review of work-up.  I discussed the assessment and treatment plan with the patient.  The patient was provided an opportunity to ask questions and all were answered.  The patient agreed with the plan and demonstrated an understanding of the instructions.  The patient was advised to call back if the symptoms worsen or if the condition fails to improve as anticipated.   Lequita Asal, MD, PhD    07/15/2019, 9:00 AM  I, Selena Batten, am acting as scribe for [employer]. Mike Gip, MD, PhD.  I, Rakia Frayne C. Mike Gip, MD, have reviewed the above documentation for accuracy and  completeness, and I agree with the above.

## 2019-07-15 ENCOUNTER — Inpatient Hospital Stay: Payer: 59 | Attending: Hematology and Oncology | Admitting: Hematology and Oncology

## 2019-07-15 ENCOUNTER — Encounter: Payer: Self-pay | Admitting: Hematology and Oncology

## 2019-07-15 ENCOUNTER — Other Ambulatory Visit: Payer: Self-pay

## 2019-07-15 ENCOUNTER — Inpatient Hospital Stay: Payer: 59

## 2019-07-15 VITALS — BP 133/85 | HR 77 | Temp 96.4°F | Wt 295.2 lb

## 2019-07-15 DIAGNOSIS — R5383 Other fatigue: Secondary | ICD-10-CM

## 2019-07-15 DIAGNOSIS — Z801 Family history of malignant neoplasm of trachea, bronchus and lung: Secondary | ICD-10-CM | POA: Insufficient documentation

## 2019-07-15 DIAGNOSIS — Z806 Family history of leukemia: Secondary | ICD-10-CM | POA: Insufficient documentation

## 2019-07-15 DIAGNOSIS — D72829 Elevated white blood cell count, unspecified: Secondary | ICD-10-CM | POA: Diagnosis not present

## 2019-07-15 DIAGNOSIS — D509 Iron deficiency anemia, unspecified: Secondary | ICD-10-CM | POA: Diagnosis not present

## 2019-07-15 DIAGNOSIS — Z800 Family history of malignant neoplasm of digestive organs: Secondary | ICD-10-CM | POA: Insufficient documentation

## 2019-07-15 DIAGNOSIS — Z840 Family history of diseases of the skin and subcutaneous tissue: Secondary | ICD-10-CM | POA: Diagnosis not present

## 2019-07-15 LAB — CBC WITH DIFFERENTIAL/PLATELET
Abs Immature Granulocytes: 0.05 10*3/uL (ref 0.00–0.07)
Basophils Absolute: 0 10*3/uL (ref 0.0–0.1)
Basophils Relative: 0 %
Eosinophils Absolute: 0.3 10*3/uL (ref 0.0–0.5)
Eosinophils Relative: 3 %
HCT: 36.2 % (ref 36.0–46.0)
Hemoglobin: 11.1 g/dL — ABNORMAL LOW (ref 12.0–15.0)
Immature Granulocytes: 1 %
Lymphocytes Relative: 27 %
Lymphs Abs: 3 10*3/uL (ref 0.7–4.0)
MCH: 24.5 pg — ABNORMAL LOW (ref 26.0–34.0)
MCHC: 30.7 g/dL (ref 30.0–36.0)
MCV: 79.9 fL — ABNORMAL LOW (ref 80.0–100.0)
Monocytes Absolute: 0.4 10*3/uL (ref 0.1–1.0)
Monocytes Relative: 4 %
Neutro Abs: 7.2 10*3/uL (ref 1.7–7.7)
Neutrophils Relative %: 65 %
Platelets: 421 10*3/uL — ABNORMAL HIGH (ref 150–400)
RBC: 4.53 MIL/uL (ref 3.87–5.11)
RDW: 15.9 % — ABNORMAL HIGH (ref 11.5–15.5)
WBC: 11.1 10*3/uL — ABNORMAL HIGH (ref 4.0–10.5)
nRBC: 0 % (ref 0.0–0.2)

## 2019-07-15 LAB — C-REACTIVE PROTEIN: CRP: 0.8 mg/dL (ref <1.0)

## 2019-07-15 LAB — SEDIMENTATION RATE: Sed Rate: 12 mm/hr (ref 0–20)

## 2019-07-15 NOTE — Progress Notes (Signed)
Pt comes in reporting that she's been feeling run down and fatigued, so she had Dr. Marcello Fennel check her labs and her "levels were low". No other complaints or concerns.

## 2019-07-21 ENCOUNTER — Ambulatory Visit: Payer: 59 | Admitting: Hematology and Oncology

## 2019-07-21 LAB — COMP PANEL: LEUKEMIA/LYMPHOMA

## 2019-07-26 ENCOUNTER — Inpatient Hospital Stay: Payer: 59 | Admitting: Hematology and Oncology

## 2019-07-30 LAB — BCR-ABL1 FISH
Cells Analyzed: 200
Cells Counted: 200

## 2019-12-03 ENCOUNTER — Telehealth: Payer: 59 | Admitting: Emergency Medicine

## 2019-12-03 DIAGNOSIS — R197 Diarrhea, unspecified: Secondary | ICD-10-CM | POA: Diagnosis not present

## 2019-12-03 DIAGNOSIS — R112 Nausea with vomiting, unspecified: Secondary | ICD-10-CM | POA: Diagnosis not present

## 2019-12-03 MED ORDER — ONDANSETRON 4 MG PO TBDP: 4.0000 mg | ORAL_TABLET | Freq: Three times a day (TID) | ORAL | 0 refills | Status: DC | PRN

## 2019-12-03 NOTE — Progress Notes (Signed)
We are sorry that you are not feeling well.  Here is how we plan to help!  **Please do not respond to this message unless you have follow up questions.**  Based on what you have shared with me it looks like you have Acute Infectious Diarrhea.  Most cases of acute diarrhea are due to infections with virus and bacteria and are self-limited conditions lasting less than 14 days.  For your symptoms you may take Imodium 2 mg tablets that are over the counter at your local pharmacy. Take two tablet now and then one after each loose stool up to 6 a day.  Antibiotics are not needed for most people with diarrhea.  Optional: Zofran 4 mg 1 tablet every 8 hours as needed for nausea and vomiting  Optional: please continue to take imodium for your diarrhea  HOME CARE  We recommend changing your diet to help with your symptoms for the next few days.  Drink plenty of fluids that contain water salt and sugar. Sports drinks such as Gatorade may help.   You may try broths, soups, bananas, applesauce, soft breads, mashed potatoes or crackers.   You are considered infectious for as long as the diarrhea continues. Hand washing or use of alcohol based hand sanitizers is recommend.  It is best to stay out of work or school until your symptoms stop.   GET HELP RIGHT AWAY  If you have dark yellow colored urine or do not pass urine frequently you should drink more fluids.    If your symptoms worsen   If you feel like you are going to pass out (faint)  You have a new problem  MAKE SURE YOU   Understand these instructions.  Will watch your condition.  Will get help right away if you are not doing well or get worse.  Your e-visit answers were reviewed by a board certified advanced clinical practitioner to complete your personal care plan.  Depending on the condition, your plan could have included both over the counter or prescription medications.  If there is a problem please reply  once you have  received a response from your provider.  Your safety is important to [place].  If you have drug allergies check your prescription carefully.    You can use MyChart to ask questions about today's visit, request a non-urgent call back, or ask for a work or school excuse for 24 hours related to this e-Visit. If it has been greater than 24 hours you will need to follow up with your provider, or enter a new e-Visit to address those concerns.   You will get an e-mail in the next two days asking about your experience.  I hope that your e-visit has been valuable and will speed your recovery. Thank you for using e-visits.   Greater than 5 but less than 10 minutes spent researching, coordinating, and implementing care for this patient today

## 2020-01-31 ENCOUNTER — Telehealth: Payer: 59 | Admitting: Nurse Practitioner

## 2020-01-31 DIAGNOSIS — R059 Cough, unspecified: Secondary | ICD-10-CM

## 2020-01-31 MED ORDER — PREDNISONE 10 MG (21) PO TBPK: ORAL_TABLET | ORAL | 0 refills | Status: DC

## 2020-01-31 MED ORDER — BENZONATATE 100 MG PO CAPS: 100.0000 mg | ORAL_CAPSULE | Freq: Three times a day (TID) | ORAL | 0 refills | Status: DC | PRN

## 2020-01-31 NOTE — Progress Notes (Signed)
We are sorry that you are not feeling well.  Here is how we plan to help!  Based on your presentation I believe you most likely have A cough due to a virus.  This is called viral bronchitis and is best treated by rest, plenty of fluids and control of the cough.  You may use Ibuprofen or Tylenol as directed to help your symptoms.     In addition you may use A non-prescription cough medication called Mucinex DM: take 2 tablets every 12 hours.  Prednisone 10 mg daily for 6 days (see taper instructions below)  Directions for 6 day taper: Day 1: 2 tablets before breakfast, 1 after both lunch & dinner and 2 at bedtime Day 2: 1 tab before breakfast, 1 after both lunch & dinner and 2 at bedtime Day 3: 1 tab at each meal & 1 at bedtime Day 4: 1 tab at breakfast, 1 at lunch, 1 at bedtime Day 5: 1 tab at breakfast & 1 tab at bedtime Day 6: 1 tab at breakfast   From your responses in the eVisit questionnaire you describe inflammation in the upper respiratory tract which is causing a significant cough.  This is commonly called Bronchitis and has four common causes:    Allergies  Viral Infections  Acid Reflux  Bacterial Infection Allergies, viruses and acid reflux are treated by controlling symptoms or eliminating the cause. An example might be a cough caused by taking certain blood pressure medications. You stop the cough by changing the medication. Another example might be a cough caused by acid reflux. Controlling the reflux helps control the cough.  USE OF BRONCHODILATOR ("RESCUE") INHALERS: There is a risk from using your bronchodilator too frequently.  The risk is that over-reliance on a medication which only relaxes the muscles surrounding the breathing tubes can reduce the effectiveness of medications prescribed to reduce swelling and congestion of the tubes themselves.  Although you feel brief relief from the bronchodilator inhaler, your asthma may actually be worsening with the tubes  becoming more swollen and filled with mucus.  This can delay other crucial treatments, such as oral steroid medications. If you need to use a bronchodilator inhaler daily, several times per day, you should discuss this with your provider.  There are probably better treatments that could be used to keep your asthma under control.     HOME CARE . Only take medications as instructed by your medical team. . Complete the entire course of an antibiotic. . Drink plenty of fluids and get plenty of rest. . Avoid close contacts especially the very young and the elderly . Cover your mouth if you cough or cough into your sleeve. . Always remember to wash your hands . A steam or ultrasonic humidifier can help congestion.   GET HELP RIGHT AWAY IF: . You develop worsening fever. . You become short of breath . You cough up blood. . Your symptoms persist after you have completed your treatment plan MAKE SURE YOU   Understand these instructions.  Will watch your condition.  Will get help right away if you are not doing well or get worse.  Your e-visit answers were reviewed by a board certified advanced clinical practitioner to complete your personal care plan.  Depending on the condition, your plan could have included both over the counter or prescription medications. If there is a problem please reply  once you have received a response from your provider. Your safety is important to us.  If you   have drug allergies check your prescription carefully.    You can use MyChart to ask questions about today's visit, request a non-urgent call back, or ask for a work or school excuse for 24 hours related to this e-Visit. If it has been greater than 24 hours you will need to follow up with your provider, or enter a new e-Visit to address those concerns. You will get an e-mail in the next two days asking about your experience.  I hope that your e-visit has been valuable and will speed your recovery. Thank you for  using e-visits.  5-10 minutes spent reviewing and documenting in chart.  

## 2020-03-03 ENCOUNTER — Telehealth: Payer: 59 | Admitting: Nurse Practitioner

## 2020-03-03 DIAGNOSIS — R059 Cough, unspecified: Secondary | ICD-10-CM

## 2020-03-03 NOTE — Progress Notes (Signed)

## 2020-03-25 ENCOUNTER — Telehealth: Payer: 59 | Admitting: Physician Assistant

## 2020-03-25 ENCOUNTER — Encounter: Payer: Self-pay | Admitting: Physician Assistant

## 2020-03-25 DIAGNOSIS — N760 Acute vaginitis: Secondary | ICD-10-CM

## 2020-03-25 MED ORDER — METRONIDAZOLE 500 MG PO TABS: 500.0000 mg | ORAL_TABLET | Freq: Two times a day (BID) | ORAL | 0 refills | Status: DC

## 2020-03-25 NOTE — Progress Notes (Signed)

## 2020-04-08 ENCOUNTER — Telehealth: Payer: 59 | Admitting: Physician Assistant

## 2020-04-08 ENCOUNTER — Encounter: Payer: Self-pay | Admitting: Physician Assistant

## 2020-04-08 DIAGNOSIS — J029 Acute pharyngitis, unspecified: Secondary | ICD-10-CM

## 2020-04-08 DIAGNOSIS — R591 Generalized enlarged lymph nodes: Secondary | ICD-10-CM | POA: Diagnosis not present

## 2020-04-08 DIAGNOSIS — Z20822 Contact with and (suspected) exposure to covid-19: Secondary | ICD-10-CM | POA: Diagnosis not present

## 2020-04-08 MED ORDER — AZITHROMYCIN 250 MG PO TABS: ORAL_TABLET | ORAL | 0 refills | Status: DC

## 2020-04-08 NOTE — Progress Notes (Signed)
E-Visit for Corona Virus Screening  Your current symptoms could be consistent with the coronavirus.  Many health care providers can now test patients at their office but not all are.  [facility] has multiple testing sites. For information on our COVID testing locations and hours go to [email or web address]  We are enrolling you in our MyChart Home Monitoring for COVID19 . Daily you will receive a questionnaire within the MyChart website. Our COVID 19 response team will be monitoring your responses daily.  Testing Information: The COVID-19 Community Testing sites are testing BY APPOINTMENT ONLY.  You can schedule online at [email or web address]  If you do not have access to a smart phone or computer you may call [phone] for an appointment.   Additional testing sites in the Community:  . For [facility] Testing sites in [facility]  [email or web address]  . For [facility] testing sites in [place]  [email or web address]  . For [email or web address]  . For [facility] testing in [place] and [email or web address]  . For [facility] testing in [facility]   [email or web address]  For  more information about community testing call [phone]   Please quarantine yourself while awaiting your test results. Please stay home for a minimum of 10 days from the first day of illness with improving symptoms and you have had 24 hours of no fever (without the use of Tylenol (Acetaminophen) Motrin (Ibuprofen) or any fever reducing medication).  Also - Do not get tested prior to returning to work because once you have had a positive test the test can stay  positive for more than a month in some cases.   You should wear a mask or cloth face covering over your nose and mouth if you must be around other people or animals, including pets (even at home). Try to stay at least 6 feet away from other people. This will protect the people around you.  Please continue good preventive care measures, including:  frequent hand-washing, avoid touching your face, cover coughs/sneezes, stay out of crowds and keep a 6 foot distance from others.  COVID-19 is a respiratory illness with symptoms that are similar to the flu. Symptoms are typically mild to moderate, but there have been cases of severe illness and death due to the virus.   The following symptoms may appear 2-14 days after exposure: . Fever . Cough . Shortness of breath or difficulty breathing . Chills . Repeated shaking with chills . Muscle pain . Headache . Sore throat . New loss of taste or smell . Fatigue . Congestion or runny nose . Nausea or vomiting . Diarrhea  Go to the nearest hospital ED for assessment if fever/cough/breathlessness are severe or illness seems like a threat to life.  It is vitally important that if you feel that you have an infection such as this virus or any other virus that you stay home and away from places where you may spread it to others.  You should avoid contact with people age 61 and older.    You may also take acetaminophen (Tylenol) as needed for fever.    Based on what you have shared with me it is likely that you have  pharyngitis.  Strep pharyngitis is inflammation and infection in the back of the throat.  This  infection is likely viral, but due to the severity of your symptoms, antibiotics are prescribed. I have prescribed Azithromycin 250 mg two tablets today and then one daily for 4 additional days. For throat pain,  we recommend over the counter oral pain relief medications such as acetaminophen or aspirin, or anti-inflammatory medications such as ibuprofen  or naproxen sodium. Topical treatments such as oral throat lozenges or sprays may be used as needed. Strep infections are not as easily transmitted as other respiratory infections, however we still recommend that you avoid close contact with loved ones, especially the very young and elderly.  Remember to wash your hands thoroughly throughout the day as this is the number one way to prevent the spread of infection and wipe down door knobs and counters with disinfectant.   Reduce your risk of any infection by using the same precautions used for avoiding the common cold or flu:  Marland Kitchen Wash your hands often with soap and warm water for at least 20 seconds.  If soap and water are not readily available, use an alcohol-based hand sanitizer with at least 60% alcohol.  . If coughing or sneezing, cover your mouth and nose by coughing or sneezing into the elbow areas of your shirt or coat, into a tissue or into your sleeve (not your hands). . Avoid shaking hands with others and consider head nods or verbal greetings only. . Avoid touching your eyes, nose, or mouth with unwashed hands.  . Avoid close contact with people who are sick. . Avoid places or events with large numbers of people in one location, like concerts or sporting events. . Carefully consider travel plans you have or are making. . If you are planning any travel outside or inside the [place], visit the CDC's Travelers' Health webpage for the latest health notices. . If you have some symptoms but not all symptoms, continue to monitor at home and seek medical attention if your symptoms worsen. . If you are having a medical emergency, call 911.  HOME CARE . Only take medications as instructed by your medical team. . Drink plenty of fluids and get plenty of rest. . A steam or ultrasonic humidifier can help if you have congestion.   GET HELP RIGHT AWAY IF YOU HAVE EMERGENCY WARNING SIGNS** FOR COVID-19. If you or someone is showing any of these signs seek  emergency medical care immediately. Call 911 or proceed to your closest emergency facility if: . You develop worsening high fever. . Trouble breathing . Bluish lips or face . Persistent pain or pressure in the chest . New confusion . Inability to wake or stay awake . You cough up blood. . Your symptoms become more severe  **This list is not all possible symptoms. Contact your medical provider for any symptoms that are sever or concerning to you.  MAKE SURE YOU   Understand these instructions.  Will watch your condition.  Will get help right away if you are not doing well or get worse.  Your e-visit answers were reviewed by a board certified advanced clinical practitioner to complete your personal care plan.  Depending on the condition, your plan could have included both over the counter or prescription medications.  If there is a problem please reply once you have received a response from your provider.  Your safety is important to [place].  If you have drug allergies check your prescription carefully.    You can use MyChart to ask questions about today's visit, request a non-urgent call back, or ask for a work or school excuse for 24 hours related to this e-Visit. If it has been greater than 24 hours you will need to follow up with your provider, or enter a  new e-Visit to address those concerns. You will get an e-mail in the next two days asking about your experience.  I hope that your e-visit has been valuable and will speed your recovery. Thank you for using e-visits.   I spent 5-10 minutes on review and completion of this note- [facility]

## 2020-06-09 ENCOUNTER — Telehealth: Payer: 59 | Admitting: Physician Assistant

## 2020-06-09 DIAGNOSIS — B373 Candidiasis of vulva and vagina: Secondary | ICD-10-CM

## 2020-06-09 DIAGNOSIS — B3731 Acute candidiasis of vulva and vagina: Secondary | ICD-10-CM

## 2020-06-09 MED ORDER — FLUCONAZOLE 150 MG PO TABS: 150.0000 mg | ORAL_TABLET | Freq: Once | ORAL | 0 refills | Status: AC

## 2020-06-09 NOTE — Progress Notes (Signed)
We are sorry that you are not feeling well. Here is how we plan to help! Based on what you shared with me it looks like you: May have a yeast vaginosis  Vaginosis is an inflammation of the vagina that can result in discharge, itching and pain. The cause is usually a change in the normal balance of vaginal bacteria or an infection. Vaginosis can also result from reduced estrogen levels after menopause.  The most common causes of vaginosis are:   Bacterial vaginosis which results from an overgrowth of one on several organisms that are normally present in your vagina.   Yeast infections which are caused by a naturally occurring fungus called candida.   Vaginal atrophy (atrophic vaginosis) which results from the thinning of the vagina from reduced estrogen levels after menopause.   Trichomoniasis which is caused by a parasite and is commonly transmitted by sexual intercourse.  Factors that increase your risk of developing vaginosis include: . Medications, such as antibiotics and steroids . Uncontrolled diabetes . Use of hygiene products such as bubble bath, vaginal spray or vaginal deodorant . Douching . Wearing damp or tight-fitting clothing . Using an intrauterine device (IUD) for birth control . Hormonal changes, such as those associated with pregnancy, birth control pills or menopause . Sexual activity . Having a sexually transmitted infection  Your treatment plan is A single Diflucan (fluconazole) 150mg tablet once.  I have electronically sent this prescription into the pharmacy that you have chosen.  Be sure to take all of the medication as directed. Stop taking any medication if you develop a rash, tongue swelling or shortness of breath. Mothers who are breast feeding should consider pumping and discarding their breast milk while on these antibiotics. However, there is no consensus that infant exposure at these doses would be harmful.  Remember that medication creams can weaken latex  condoms. .   HOME CARE:  Good hygiene may prevent some types of vaginosis from recurring and may relieve some symptoms:  . Avoid baths, hot tubs and whirlpool spas. Rinse soap from your outer genital area after a shower, and dry the area well to prevent irritation. Don't use scented or harsh soaps, such as those with deodorant or antibacterial action. . Avoid irritants. These include scented tampons and pads. . Wipe from front to back after using the toilet. Doing so avoids spreading fecal bacteria to your vagina.  Other things that may help prevent vaginosis include:  . Don't douche. Your vagina doesn't require cleansing other than normal bathing. Repetitive douching disrupts the normal organisms that reside in the vagina and can actually increase your risk of vaginal infection. Douching won't clear up a vaginal infection. . Use a latex condom. Both female and female latex condoms may help you avoid infections spread by sexual contact. . Wear cotton underwear. Also wear pantyhose with a cotton crotch. If you feel comfortable without it, skip wearing underwear to bed. Yeast thrives in moist environments Your symptoms should improve in the next day or two.  GET HELP RIGHT AWAY IF:  . You have pain in your lower abdomen ( pelvic area or over your ovaries) . You develop nausea or vomiting . You develop a fever . Your discharge changes or worsens . You have persistent pain with intercourse . You develop shortness of breath, a rapid pulse, or you faint.  These symptoms could be signs of problems or infections that need to be evaluated by a medical provider now.  MAKE SURE YOU      Understand these instructions.  Will watch your condition.  Will get help right away if you are not doing well or get worse.  Your e-visit answers were reviewed by a board certified advanced clinical practitioner to complete your personal care plan. Depending upon the condition, your plan could have included  both over the counter or prescription medications. Please review your pharmacy choice to make sure that you have choses a pharmacy that is open for you to pick up any needed prescription, Your safety is important to us. If you have drug allergies check your prescription carefully.   You can use MyChart to ask questions about today's visit, request a non-urgent call back, or ask for a work or school excuse for 24 hours related to this e-Visit. If it has been greater than 24 hours you will need to follow up with your provider, or enter a new e-Visit to address those concerns. You will get a MyChart message within the next two days asking about your experience. I hope that your e-visit has been valuable and will speed your recovery.  I provided 5 minutes of non face-to-face time during this encounter for chart review and documentation.   

## 2020-07-07 ENCOUNTER — Telehealth: Payer: 59 | Admitting: Nurse Practitioner

## 2020-07-07 DIAGNOSIS — B9689 Other specified bacterial agents as the cause of diseases classified elsewhere: Secondary | ICD-10-CM | POA: Diagnosis not present

## 2020-07-07 DIAGNOSIS — N760 Acute vaginitis: Secondary | ICD-10-CM

## 2020-07-08 MED ORDER — METRONIDAZOLE 500 MG PO TABS: 500.0000 mg | ORAL_TABLET | Freq: Two times a day (BID) | ORAL | 0 refills | Status: DC

## 2020-07-08 NOTE — Progress Notes (Signed)

## 2020-07-19 ENCOUNTER — Ambulatory Visit: Payer: 59 | Admitting: Obstetrics & Gynecology

## 2020-08-10 DIAGNOSIS — R635 Abnormal weight gain: Secondary | ICD-10-CM | POA: Insufficient documentation

## 2020-08-10 DIAGNOSIS — Z6841 Body Mass Index (BMI) 40.0 and over, adult: Secondary | ICD-10-CM | POA: Insufficient documentation

## 2020-08-10 DIAGNOSIS — D649 Anemia, unspecified: Secondary | ICD-10-CM | POA: Insufficient documentation

## 2020-08-10 DIAGNOSIS — F331 Major depressive disorder, recurrent, moderate: Secondary | ICD-10-CM | POA: Insufficient documentation

## 2020-08-10 DIAGNOSIS — R7309 Other abnormal glucose: Secondary | ICD-10-CM | POA: Insufficient documentation

## 2020-09-21 ENCOUNTER — Telehealth: Payer: 59 | Admitting: Physician Assistant

## 2020-09-21 DIAGNOSIS — B373 Candidiasis of vulva and vagina: Secondary | ICD-10-CM

## 2020-09-21 DIAGNOSIS — B3731 Acute candidiasis of vulva and vagina: Secondary | ICD-10-CM

## 2020-09-21 MED ORDER — FLUCONAZOLE 150 MG PO TABS: 150.0000 mg | ORAL_TABLET | Freq: Once | ORAL | 0 refills | Status: AC

## 2020-09-21 NOTE — Progress Notes (Signed)

## 2020-09-21 NOTE — Progress Notes (Signed)
I have spent 5 minutes in review of e-visit questionnaire, review and updating patient chart, medical decision making and response to patient.   Meelah Tallo Cody Ramiel Forti, PA-C    

## 2020-09-23 ENCOUNTER — Observation Stay
Admission: EM | Admit: 2020-09-23 | Discharge: 2020-09-25 | Disposition: A | Discharge status: Home / Self Care | Payer: 59 | Attending: General Surgery | Admitting: General Surgery

## 2020-09-23 ENCOUNTER — Other Ambulatory Visit: Payer: Self-pay

## 2020-09-23 DIAGNOSIS — I10 Essential (primary) hypertension: Secondary | ICD-10-CM | POA: Insufficient documentation

## 2020-09-23 DIAGNOSIS — Z79899 Other long term (current) drug therapy: Secondary | ICD-10-CM | POA: Insufficient documentation

## 2020-09-23 DIAGNOSIS — R0602 Shortness of breath: Secondary | ICD-10-CM

## 2020-09-23 DIAGNOSIS — R112 Nausea with vomiting, unspecified: Secondary | ICD-10-CM | POA: Diagnosis present

## 2020-09-23 DIAGNOSIS — Z20822 Contact with and (suspected) exposure to covid-19: Secondary | ICD-10-CM | POA: Diagnosis not present

## 2020-09-23 DIAGNOSIS — R1031 Right lower quadrant pain: Secondary | ICD-10-CM | POA: Diagnosis not present

## 2020-09-23 DIAGNOSIS — K353 Acute appendicitis with localized peritonitis, without perforation or gangrene: Secondary | ICD-10-CM | POA: Diagnosis not present

## 2020-09-23 LAB — COMPREHENSIVE METABOLIC PANEL
ALT: 23 U/L (ref 0–44)
AST: 30 U/L (ref 15–41)
Albumin: 3.8 g/dL (ref 3.5–5.0)
Alkaline Phosphatase: 98 U/L (ref 38–126)
Anion gap: 12 (ref 5–15)
BUN: 9 mg/dL (ref 6–20)
CO2: 19 mmol/L — ABNORMAL LOW (ref 22–32)
Calcium: 8.5 mg/dL — ABNORMAL LOW (ref 8.9–10.3)
Chloride: 103 mmol/L (ref 98–111)
Creatinine, Ser: 0.86 mg/dL (ref 0.44–1.00)
GFR, Estimated: >60 mL/min (ref >60)
Glucose, Bld: 140 mg/dL — ABNORMAL HIGH (ref 70–99)
Potassium: 3.4 mmol/L — ABNORMAL LOW (ref 3.5–5.1)
Sodium: 134 mmol/L — ABNORMAL LOW (ref 135–145)
Total Bilirubin: 1.2 mg/dL (ref 0.3–1.2)
Total Protein: 7.1 g/dL (ref 6.5–8.1)

## 2020-09-23 LAB — CBC
HCT: 36.7 % (ref 36.0–46.0)
Hemoglobin: 11.7 g/dL — ABNORMAL LOW (ref 12.0–15.0)
MCH: 25.4 pg — ABNORMAL LOW (ref 26.0–34.0)
MCHC: 31.9 g/dL (ref 30.0–36.0)
MCV: 79.6 fL — ABNORMAL LOW (ref 80.0–100.0)
Platelets: 455 10*3/uL — ABNORMAL HIGH (ref 150–400)
RBC: 4.61 MIL/uL (ref 3.87–5.11)
RDW: 15.7 % — ABNORMAL HIGH (ref 11.5–15.5)
WBC: 22.8 10*3/uL — ABNORMAL HIGH (ref 4.0–10.5)
nRBC: 0 % (ref 0.0–0.2)

## 2020-09-23 LAB — URINALYSIS, COMPLETE (UACMP) WITH MICROSCOPIC
Bilirubin Urine: NEGATIVE
Glucose, UA: NEGATIVE mg/dL
Hgb urine dipstick: NEGATIVE
Ketones, ur: NEGATIVE mg/dL
Leukocytes,Ua: NEGATIVE
Nitrite: NEGATIVE
Protein, ur: NEGATIVE mg/dL
Specific Gravity, Urine: 1.02 (ref 1.005–1.030)
pH: 7 (ref 5.0–8.0)

## 2020-09-23 LAB — POC URINE PREG, ED: Preg Test, Ur: NEGATIVE

## 2020-09-23 LAB — MAGNESIUM: Magnesium: 1.7 mg/dL (ref 1.7–2.4)

## 2020-09-23 LAB — LIPASE, BLOOD: Lipase: 22 U/L (ref 11–51)

## 2020-09-23 LAB — LACTIC ACID, PLASMA: Lactic Acid, Venous: 3.3 mmol/L (ref 0.5–1.9)

## 2020-09-23 MED ORDER — LACTATED RINGERS IV SOLN: INTRAVENOUS | Status: DC

## 2020-09-23 MED ORDER — METRONIDAZOLE 500 MG/100ML IV SOLN
500.0000 mg | Freq: Once | INTRAVENOUS | Status: AC
  Administered 2020-09-24: 500 mg via INTRAVENOUS
  Filled 2020-09-23: qty 100

## 2020-09-23 MED ORDER — LACTATED RINGERS IV BOLUS (SEPSIS)
1000.0000 mL | Freq: Once | INTRAVENOUS | Status: AC
  Administered 2020-09-24: 1000 mL via INTRAVENOUS

## 2020-09-23 MED ORDER — LACTATED RINGERS IV BOLUS (SEPSIS)
800.0000 mL | Freq: Once | INTRAVENOUS | Status: AC
  Administered 2020-09-24: 800 mL via INTRAVENOUS

## 2020-09-23 MED ORDER — SODIUM CHLORIDE 0.9 % IV SOLN
2.0000 g | Freq: Once | INTRAVENOUS | Status: AC
  Administered 2020-09-24: 2 g via INTRAVENOUS
  Filled 2020-09-23: qty 20

## 2020-09-23 MED ORDER — MORPHINE SULFATE (PF) 4 MG/ML IV SOLN
4.0000 mg | Freq: Once | INTRAVENOUS | Status: AC
  Administered 2020-09-24: 4 mg via INTRAVENOUS
  Filled 2020-09-23: qty 1

## 2020-09-23 MED ORDER — ONDANSETRON 4 MG PO TBDP
4.0000 mg | ORAL_TABLET | Freq: Once | ORAL | Status: AC | PRN
  Administered 2020-09-23: 4 mg via ORAL
  Filled 2020-09-23: qty 1

## 2020-09-23 MED ORDER — ONDANSETRON HCL 4 MG/2ML IJ SOLN
4.0000 mg | Freq: Once | INTRAMUSCULAR | Status: AC
  Administered 2020-09-24: 4 mg via INTRAVENOUS
  Filled 2020-09-23: qty 2

## 2020-09-23 NOTE — ED Triage Notes (Signed)
Pt c/o emesis, fever, SOB, and right lower abdominal pain since last night. Pt has thrown up more than 15 times since last night. Pt denies pain with urination. Pt denies CP, dizziness.

## 2020-09-23 NOTE — ED Provider Notes (Signed)
[facility] Emergency Department Provider Note  ____________________________________________   Event Date/Time   First MD Initiated Contact with Patient 09/23/20 2334     (approximate)  I have reviewed the triage vital signs and the nursing notes.   HISTORY  Chief Complaint Abdominal Pain    HPI Cheryl Pope is a 30 y.o. female with history of hypertension, depression, obesity who presents to the emergency department complaints of nausea and vomiting that started on August 19 at 11 PM.  States at 3 AM on August 20 she started having stabbing right lower quadrant pain without radiation.  States it was hard to walk to the pain was painful with palpation.  No alleviating factors.  Does have a fever here.  No diarrhea, dysuria, hematuria, vaginal bleeding or discharge.  No sick contacts or recent travel.  Has had previous BTL.  Last menstrual period 09/09/2020.      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   Iron deficiency anemia 06/07/2017   Leukocytosis 06/05/2017   Postpartum care following vaginal delivery 04/07/2015   Hypertension in antepartum pregnancy 04/04/2015   Labor and delivery, indication for care 03/10/2015   Nausea and vomiting of pregnancy, antepartum 02/21/2015   Irregular contractions 02/21/2015    Past Surgical History:  Procedure Laterality Date   TONSILLECTOMY     TUBAL LIGATION Bilateral 04/06/2015   Procedure: POST PARTUM TUBAL LIGATION;  Surgeon: Nadara Mustardobert P Harris, MD;  Location: [facility] ORS;  Service: Gynecology;  Laterality: Bilateral;    Prior to Admission medications   Medication Sig Start Date End Date Taking? Authorizing Provider  buPROPion (WELLBUTRIN XL) 150 MG 24 hr tablet Take 150 mg by mouth at bedtime. 06/29/19   [provider]  ferrous sulfate 325 (65 FE) MG tablet Take 325 mg by mouth daily with breakfast.    [provider]  metroNIDAZOLE (FLAGYL) 500 MG tablet Take 1 tablet (500 mg total) by mouth 2 (two) times daily. 07/08/20   Daphine DeutscherMartin, Mary-Margaret, FNP  ondansetron (ZOFRAN ODT) 4 MG disintegrating tablet Take 1 tablet (4 mg total) by mouth every 8 (eight) hours as needed for nausea or vomiting. 12/03/19   Arthor CaptainHarris, Abigail, PA-C    Allergies Codeine  Family History  Problem Relation Age of Onset   Heart disease Father    Kidney disease Paternal Uncle    Skin cancer Mother    Leukemia Maternal Grandfather    Lung cancer Paternal Grandfather    Colon cancer Paternal Grandfather    Cancer Neg Hx     Social History Social History   Tobacco Use   Smoking status: Never   Smokeless tobacco: Never  Substance Use Topics   Alcohol use: No   Drug use: No    Review of Systems Constitutional: + fever. Eyes: No visual changes. ENT: No sore throat. Cardiovascular: Denies chest pain. Respiratory: Denies shortness of breath. Gastrointestinal: + nausea, vomiting.  No diarrhea. Genitourinary: Negative for dysuria. Musculoskeletal: Negative for back pain. Skin: Negative for rash. Neurological: Negative for focal weakness or numbness.  ____________________________________________   PHYSICAL EXAM:  VITAL SIGNS: ED Triage Vitals  Enc Vitals Group     BP 09/23/20 2005 119/64     Pulse Rate 09/23/20 2005 (!) 144  Resp 09/23/20 2005 18     Temp 09/23/20 2005 (!) 101.8 F (38.8 C)     Temp Source 09/23/20 2005 Oral     SpO2 09/23/20 2005 98 %     Weight 09/23/20 2003 280 lb (127 kg)     Height 09/23/20 2003 5\' 5"  (1.651 m)     Head Circumference --      Peak Flow --      Pain Score 09/23/20 2003 9     Pain Loc --      Pain Edu? --      Excl. in GC? --    CONSTITUTIONAL: Alert and oriented and responds appropriately to questions.  Obese, nontoxic-appearing HEAD:  Normocephalic EYES: Conjunctivae clear, pupils appear equal, EOM appear intact ENT: normal nose; moist mucous membranes NECK: Supple, normal ROM CARD: RRR; S1 and S2 appreciated; no murmurs, no clicks, no rubs, no gallops RESP: Normal chest excursion without splinting or tachypnea; breath sounds clear and equal bilaterally; no wheezes, no rhonchi, no rales, no hypoxia or respiratory distress, speaking full sentences ABD/GI: Normal bowel sounds; non-distended; soft, tender to palpation in the right lower quadrant with voluntary guarding, no rebound BACK: The back appears normal EXT: Normal ROM in all joints; no deformity noted, no edema; no cyanosis SKIN: Normal color for age and race; warm; no rash on exposed skin NEURO: Moves all extremities equally, normal speech, ambulates with normal gait PSYCH: The patient's mood and manner are appropriate.  ____________________________________________   LABS (all labs ordered are listed, but only abnormal results are displayed)  Labs Reviewed  COMPREHENSIVE METABOLIC PANEL - Abnormal; Notable for the following components:      Result Value   Sodium 134 (*)    Potassium 3.4 (*)    CO2 19 (*)    Glucose, Bld 140 (*)    Calcium 8.5 (*)    All other components within normal limits  URINALYSIS, COMPLETE (UACMP) WITH MICROSCOPIC - Abnormal; Notable for the following components:   Bacteria, UA RARE (*)    All other components within normal limits  LACTIC ACID, PLASMA - Abnormal; Notable for the following components:   Lactic Acid, Venous 3.3 (*)    All other components within normal limits  CBC - Abnormal; Notable for the following components:   WBC 22.8 (*)    Hemoglobin 11.7 (*)    MCV 79.6 (*)    MCH 25.4 (*)    RDW 15.7 (*)    Platelets 455 (*)    All other components within normal limits  RESP PANEL BY RT-PCR (FLU A&B, COVID) ARPGX2  CULTURE, BLOOD (ROUTINE X 2)  CULTURE, BLOOD (ROUTINE X 2)  URINE CULTURE  LACTIC ACID, PLASMA   MAGNESIUM  LIPASE, BLOOD  PROTIME-INR  APTT  POC URINE PREG, ED   ____________________________________________  EKG   EKG Interpretation  Date/Time:  Saturday September 23 2020 20:13:43 EDT Ventricular Rate:  136 PR Interval:  112 QRS Duration: 76 QT Interval:  370 QTC Calculation: 556 R Axis:   58 Text Interpretation:  Critical Test Result: Long QTc Sinus tachycardia Nonspecific ST and T wave abnormality Abnormal ECG Confirmed by [birth year] [phone]) on 09/24/2020 12:44:49 AM        ____________________________________________  RADIOLOGY 09/26/2020 Rushil Kimbrell, personally viewed and evaluated these images (plain radiographs) as part of my medical decision making, as well as reviewing the written report by the radiologist.  ED MD interpretation: Acute appendicitis without rupture, abscess.  Official radiology report(s): CT  ABDOMEN PELVIS W CONTRAST  Result Date: 09/24/2020 CLINICAL DATA:  Right lower quadrant abdominal pain, vomiting, fever, shortness of breath since last night. EXAM: CT ABDOMEN AND PELVIS WITH CONTRAST TECHNIQUE: Multidetector CT imaging of the abdomen and pelvis was performed using the standard protocol following bolus administration of intravenous contrast. CONTRAST:  42mL OMNIPAQUE IOHEXOL 350 MG/ML SOLN COMPARISON:  None. FINDINGS: Lower chest: Lung bases are clear. Hepatobiliary: Diffuse fatty infiltration of the liver. No focal lesions. Cholelithiasis with stone in the gallbladder. No gallbladder wall thickening. No bile duct dilatation. Pancreas: Unremarkable. No pancreatic ductal dilatation or surrounding inflammatory changes. Spleen: Normal in size without focal abnormality. Adrenals/Urinary Tract: No adrenal gland nodules. Several bilateral intrarenal stones. No hydronephrosis or hydroureter. No ureteral stones are demonstrated. Bladder is unremarkable. Stomach/Bowel: Stomach, small bowel, and colon are not abnormally distended. Abnormal appendix with  appendiceal diameter measuring 1.3 cm. Stranding around the appendix. Prominent right lower quadrant lymph nodes are likely reactive. Appendix: Location: Right lower quadrant inferior to the cecum Diameter: 13 mm Appendicolith: No Mucosal hyper-enhancement: No Extraluminal gas: No Periappendiceal collection: No Vascular/Lymphatic: No significant vascular findings are present. No enlarged abdominal or pelvic lymph nodes. Reproductive: Uterus and bilateral adnexa are unremarkable. Other: No free air or free fluid in the abdomen. Abdominal wall musculature appears intact. Musculoskeletal: No acute or significant osseous findings. IMPRESSION: 1. Acute appendicitis with appendiceal diameter 1.3 cm and periappendiceal stranding. No abscess. 2. Multiple bilateral nonobstructing intrarenal stones. 3. Diffuse fatty infiltration of the liver. 4. Cholelithiasis without evidence of cholecystitis. Electronically Signed   By: Burman Nieves M.D.   On: 09/24/2020 00:46    ____________________________________________   PROCEDURES  Procedure(s) performed (including Critical Care):  Procedures  CRITICAL CARE Performed by: Baxter Hire Aiyonna Lucado   Total critical care time: 45 minutes  Critical care time was exclusive of separately billable procedures and treating other patients.  Critical care was necessary to treat or prevent imminent or life-threatening deterioration.  Critical care was time spent personally by me on the following activities: development of treatment plan with patient and/or surrogate as well as nursing, discussions with consultants, evaluation of patient's response to treatment, examination of patient, obtaining history from patient or surrogate, ordering and performing treatments and interventions, ordering and review of laboratory studies, ordering and review of radiographic studies, pulse oximetry and re-evaluation of patient's condition.  ____________________________________________   INITIAL  IMPRESSION / ASSESSMENT AND PLAN / ED COURSE  As part of my medical decision making, I reviewed the following data within the electronic MEDICAL RECORD NUMBER Nursing notes reviewed and incorporated, Labs reviewed , EKG interpreted , Old chart reviewed, A consult was requested and obtained from this/these consultant(s) Surgery, CT reviewed, and Notes from prior ED visits         Patient here with right lower quadrant pain.  Initially febrile, tachycardic and found to have a leukocytosis of 22,000 and a lactic of 3.3.  Will give IV fluids based on ideal body weight and Rocephin and Flagyl for concerns for an intra-abdominal process causing SIRS.  Blood pressure normal at this time.  She is nontoxic-appearing.  Suspect appendicitis.  Differential also includes colitis, diverticulitis, pyelonephritis, kidney stone, UTI.  ED PROGRESS  CT scan shows acute appendicitis without perforation or abscess.  Will discuss with general surgery.  Patient's repeat lactic has improved.  Vital signs improving.  Pain well controlled with morphine.  1:24 AM  Spoke with Dr. Lady Gary with general surgery for admission.  Appreciate surgery help.  Patient  updated with plan.  She is n.p.o. at this time.   I reviewed all nursing notes and pertinent previous records as available.  I have reviewed and interpreted any EKGs, lab and urine results, imaging (as available).  ____________________________________________   FINAL CLINICAL IMPRESSION(S) / ED DIAGNOSES  Final diagnoses:  Acute appendicitis with localized peritonitis, without perforation, abscess, or gangrene     ED Discharge Orders     None       *Please note:  Shaina R Tarbell was evaluated in Emergency Department on 09/24/2020 for the symptoms described in the history of present illness. She was evaluated in the context of the global COVID-19 pandemic, which necessitated consideration that the patient might be at risk for infection with the SARS-CoV-2  virus that causes COVID-19. Institutional protocols and algorithms that pertain to the evaluation of patients at risk for COVID-19 are in a state of rapid change based on information released by regulatory bodies including the CDC and federal and state organizations. These policies and algorithms were followed during the patient's care in the ED.  Some ED evaluations and interventions may be delayed as a result of limited staffing during and the pandemic.*   Note:  This document was prepared using Dragon voice recognition software and may include unintentional dictation errors.    Zekiah Caruth, Layla Maw, DO 09/24/20 [phone]

## 2020-09-24 ENCOUNTER — Encounter
Admission: EM | Disposition: A | Discharge status: Home / Self Care | Payer: Self-pay | Source: Home / Self Care | Attending: Emergency Medicine

## 2020-09-24 ENCOUNTER — Observation Stay: Payer: 59 | Admitting: Anesthesiology

## 2020-09-24 ENCOUNTER — Encounter: Payer: Self-pay | Admitting: General Surgery

## 2020-09-24 ENCOUNTER — Other Ambulatory Visit: Payer: 59

## 2020-09-24 ENCOUNTER — Emergency Department: Payer: 59

## 2020-09-24 ENCOUNTER — Observation Stay: Payer: 59

## 2020-09-24 DIAGNOSIS — K353 Acute appendicitis with localized peritonitis, without perforation or gangrene: Secondary | ICD-10-CM | POA: Diagnosis not present

## 2020-09-24 HISTORY — PX: XI ROBOTIC LAPAROSCOPIC ASSISTED APPENDECTOMY: SHX6877

## 2020-09-24 LAB — CBC
HCT: 32.9 % — ABNORMAL LOW (ref 36.0–46.0)
Hemoglobin: 10.2 g/dL — ABNORMAL LOW (ref 12.0–15.0)
MCH: 25.2 pg — ABNORMAL LOW (ref 26.0–34.0)
MCHC: 31 g/dL (ref 30.0–36.0)
MCV: 81.4 fL (ref 80.0–100.0)
Platelets: 368 10*3/uL (ref 150–400)
RBC: 4.04 MIL/uL (ref 3.87–5.11)
RDW: 15.9 % — ABNORMAL HIGH (ref 11.5–15.5)
WBC: 21.4 10*3/uL — ABNORMAL HIGH (ref 4.0–10.5)
nRBC: 0 % (ref 0.0–0.2)

## 2020-09-24 LAB — BASIC METABOLIC PANEL
Anion gap: 7 (ref 5–15)
BUN: 8 mg/dL (ref 6–20)
CO2: 29 mmol/L (ref 22–32)
Calcium: 8 mg/dL — ABNORMAL LOW (ref 8.9–10.3)
Chloride: 103 mmol/L (ref 98–111)
Creatinine, Ser: 0.7 mg/dL (ref 0.44–1.00)
GFR, Estimated: >60 mL/min (ref >60)
Glucose, Bld: 125 mg/dL — ABNORMAL HIGH (ref 70–99)
Potassium: 3.8 mmol/L (ref 3.5–5.1)
Sodium: 139 mmol/L (ref 135–145)

## 2020-09-24 LAB — APTT: aPTT: 29 seconds (ref 24–36)

## 2020-09-24 LAB — RESP PANEL BY RT-PCR (FLU A&B, COVID) ARPGX2
Influenza A by PCR: NEGATIVE
Influenza B by PCR: NEGATIVE
SARS Coronavirus 2 by RT PCR: NEGATIVE

## 2020-09-24 LAB — HIV ANTIBODY (ROUTINE TESTING W REFLEX): HIV Screen 4th Generation wRfx: NONREACTIVE

## 2020-09-24 LAB — LACTIC ACID, PLASMA: Lactic Acid, Venous: 1.9 mmol/L (ref 0.5–1.9)

## 2020-09-24 LAB — MAGNESIUM: Magnesium: 2 mg/dL (ref 1.7–2.4)

## 2020-09-24 LAB — PHOSPHORUS: Phosphorus: 3.9 mg/dL (ref 2.5–4.6)

## 2020-09-24 LAB — PROTIME-INR
INR: 1.1 (ref 0.8–1.2)
Prothrombin Time: 14.4 seconds (ref 11.4–15.2)

## 2020-09-24 SURGERY — APPENDECTOMY, ROBOT-ASSISTED, LAPAROSCOPIC
Anesthesia: General | Site: Abdomen

## 2020-09-24 MED ORDER — SUGAMMADEX SODIUM 500 MG/5ML IV SOLN
INTRAVENOUS | Status: DC | PRN
  Administered 2020-09-24: 500 mg via INTRAVENOUS

## 2020-09-24 MED ORDER — PROPOFOL 10 MG/ML IV BOLUS
INTRAVENOUS | Status: DC | PRN
Start: 2020-09-24 — End: 2020-09-24
  Administered 2020-09-24: 200 mg via INTRAVENOUS

## 2020-09-24 MED ORDER — FENTANYL CITRATE (PF) 100 MCG/2ML IJ SOLN
INTRAMUSCULAR | Status: AC
  Filled 2020-09-24: qty 2

## 2020-09-24 MED ORDER — ONDANSETRON HCL 4 MG/2ML IJ SOLN
INTRAMUSCULAR | Status: DC | PRN
  Administered 2020-09-24: 4 mg via INTRAVENOUS

## 2020-09-24 MED ORDER — EPHEDRINE 5 MG/ML INJ
INTRAVENOUS | Status: AC
  Filled 2020-09-24: qty 5

## 2020-09-24 MED ORDER — MIDAZOLAM HCL 2 MG/2ML IJ SOLN
INTRAMUSCULAR | Status: AC
  Filled 2020-09-24: qty 2

## 2020-09-24 MED ORDER — DEXAMETHASONE SODIUM PHOSPHATE 10 MG/ML IJ SOLN
INTRAMUSCULAR | Status: DC | PRN
  Administered 2020-09-24: 10 mg via INTRAVENOUS

## 2020-09-24 MED ORDER — ONDANSETRON 4 MG PO TBDP: 4.0000 mg | ORAL_TABLET | Freq: Four times a day (QID) | ORAL | Status: DC | PRN

## 2020-09-24 MED ORDER — ACETAMINOPHEN 10 MG/ML IV SOLN: 1000.0000 mg | Freq: Once | INTRAVENOUS | Status: DC | PRN

## 2020-09-24 MED ORDER — EPHEDRINE SULFATE 50 MG/ML IJ SOLN
INTRAMUSCULAR | Status: DC | PRN
  Administered 2020-09-24: 10 mg via INTRAVENOUS

## 2020-09-24 MED ORDER — BUPROPION HCL ER (XL) 150 MG PO TB24: 150.0000 mg | ORAL_TABLET | Freq: Every day | ORAL | Status: DC

## 2020-09-24 MED ORDER — OXYCODONE HCL 5 MG/5ML PO SOLN: 5.0000 mg | Freq: Once | ORAL | Status: DC | PRN

## 2020-09-24 MED ORDER — FENTANYL CITRATE (PF) 100 MCG/2ML IJ SOLN: 25.0000 ug | INTRAMUSCULAR | Status: DC | PRN

## 2020-09-24 MED ORDER — MIDAZOLAM HCL 2 MG/2ML IJ SOLN
INTRAMUSCULAR | Status: DC | PRN
  Administered 2020-09-24: 2 mg via INTRAVENOUS

## 2020-09-24 MED ORDER — 0.9 % SODIUM CHLORIDE (POUR BTL) OPTIME
TOPICAL | Status: DC | PRN
  Administered 2020-09-24: 500 mL

## 2020-09-24 MED ORDER — IOHEXOL 350 MG/ML SOLN
80.0000 mL | Freq: Once | INTRAVENOUS | Status: AC | PRN
  Administered 2020-09-24: 80 mL via INTRAVENOUS

## 2020-09-24 MED ORDER — KETOROLAC TROMETHAMINE 30 MG/ML IJ SOLN
30.0000 mg | Freq: Four times a day (QID) | INTRAMUSCULAR | Status: DC | PRN
  Administered 2020-09-24 – 2020-09-25 (×3): 30 mg via INTRAVENOUS
  Filled 2020-09-24 (×4): qty 1

## 2020-09-24 MED ORDER — LIDOCAINE HCL (CARDIAC) PF 100 MG/5ML IV SOSY
PREFILLED_SYRINGE | INTRAVENOUS | Status: DC | PRN
  Administered 2020-09-24: 100 mg via INTRAVENOUS

## 2020-09-24 MED ORDER — ACETAMINOPHEN 500 MG PO TABS
1000.0000 mg | ORAL_TABLET | Freq: Four times a day (QID) | ORAL | Status: DC
  Administered 2020-09-24 – 2020-09-25 (×5): 1000 mg via ORAL
  Filled 2020-09-24 (×5): qty 2

## 2020-09-24 MED ORDER — LIDOCAINE HCL (PF) 2 % IJ SOLN
INTRAMUSCULAR | Status: AC
  Filled 2020-09-24: qty 5

## 2020-09-24 MED ORDER — PIPERACILLIN-TAZOBACTAM 3.375 G IVPB
INTRAVENOUS | Status: AC
  Filled 2020-09-24: qty 50

## 2020-09-24 MED ORDER — SUCCINYLCHOLINE CHLORIDE 200 MG/10ML IV SOSY
PREFILLED_SYRINGE | INTRAVENOUS | Status: AC
  Filled 2020-09-24: qty 10

## 2020-09-24 MED ORDER — DROPERIDOL 2.5 MG/ML IJ SOLN: 0.6250 mg | Freq: Once | INTRAMUSCULAR | Status: DC | PRN

## 2020-09-24 MED ORDER — ROCURONIUM BROMIDE 10 MG/ML (PF) SYRINGE
PREFILLED_SYRINGE | INTRAVENOUS | Status: AC
  Filled 2020-09-24: qty 10

## 2020-09-24 MED ORDER — PROPOFOL 10 MG/ML IV BOLUS
INTRAVENOUS | Status: AC
  Filled 2020-09-24: qty 20

## 2020-09-24 MED ORDER — ONDANSETRON HCL 4 MG/2ML IJ SOLN
INTRAMUSCULAR | Status: AC
  Filled 2020-09-24: qty 2

## 2020-09-24 MED ORDER — IPRATROPIUM-ALBUTEROL 0.5-2.5 (3) MG/3ML IN SOLN: 3.0000 mL | RESPIRATORY_TRACT | Status: DC

## 2020-09-24 MED ORDER — DEXAMETHASONE SODIUM PHOSPHATE 10 MG/ML IJ SOLN
INTRAMUSCULAR | Status: AC
  Filled 2020-09-24: qty 1

## 2020-09-24 MED ORDER — ONDANSETRON HCL 4 MG/2ML IJ SOLN: 4.0000 mg | Freq: Four times a day (QID) | INTRAMUSCULAR | Status: DC | PRN

## 2020-09-24 MED ORDER — LIDOCAINE-EPINEPHRINE 1 %-1:100000 IJ SOLN
INTRAMUSCULAR | Status: DC | PRN
  Administered 2020-09-24: 16 mL

## 2020-09-24 MED ORDER — ROCURONIUM BROMIDE 100 MG/10ML IV SOLN
INTRAVENOUS | Status: DC | PRN
  Administered 2020-09-24: 50 mg via INTRAVENOUS
  Administered 2020-09-24: 20 mg via INTRAVENOUS

## 2020-09-24 MED ORDER — PHENYLEPHRINE HCL (PRESSORS) 10 MG/ML IV SOLN
INTRAVENOUS | Status: DC | PRN
  Administered 2020-09-24: 100 ug via INTRAVENOUS

## 2020-09-24 MED ORDER — PROMETHAZINE HCL 25 MG/ML IJ SOLN: 6.2500 mg | INTRAMUSCULAR | Status: DC | PRN

## 2020-09-24 MED ORDER — FLUOXETINE HCL 10 MG PO CAPS
20.0000 mg | ORAL_CAPSULE | Freq: Every day | ORAL | Status: DC
  Administered 2020-09-24: 20 mg via ORAL
  Filled 2020-09-24: qty 2

## 2020-09-24 MED ORDER — IPRATROPIUM-ALBUTEROL 0.5-2.5 (3) MG/3ML IN SOLN
RESPIRATORY_TRACT | Status: AC
  Administered 2020-09-24: 3 mL via RESPIRATORY_TRACT
  Filled 2020-09-24: qty 3

## 2020-09-24 MED ORDER — OXYCODONE HCL 5 MG PO TABS
5.0000 mg | ORAL_TABLET | Freq: Once | ORAL | Status: DC | PRN
Start: 2020-09-24 — End: 2020-09-24

## 2020-09-24 MED ORDER — KCL IN DEXTROSE-NACL 20-5-0.9 MEQ/L-%-% IV SOLN
INTRAVENOUS | Status: DC
  Filled 2020-09-24 (×6): qty 1000

## 2020-09-24 MED ORDER — FENTANYL CITRATE (PF) 100 MCG/2ML IJ SOLN
INTRAMUSCULAR | Status: DC | PRN
  Administered 2020-09-24 (×2): 50 ug via INTRAVENOUS

## 2020-09-24 MED ORDER — PIPERACILLIN-TAZOBACTAM 3.375 G IVPB
3.3750 g | Freq: Three times a day (TID) | INTRAVENOUS | Status: DC
  Administered 2020-09-24 (×2): 3.375 g via INTRAVENOUS
  Filled 2020-09-24 (×5): qty 50

## 2020-09-24 MED ORDER — HYDROMORPHONE HCL 1 MG/ML IJ SOLN
0.5000 mg | INTRAMUSCULAR | Status: DC | PRN
  Administered 2020-09-24: 0.5 mg via INTRAVENOUS
  Filled 2020-09-24: qty 1

## 2020-09-24 SURGICAL SUPPLY — 62 items
BAG INFUSER PRESSURE 100CC (MISCELLANEOUS) IMPLANT
BLADE CLIPPER SURG (BLADE) ×2 IMPLANT
BLADE SURG SZ11 CARB STEEL (BLADE) ×2 IMPLANT
CANNULA REDUC XI 12-8 STAPL (CANNULA) ×1
CANNULA REDUCER 12-8 DVNC XI (CANNULA) ×1 IMPLANT
CHLORAPREP W/TINT 26 (MISCELLANEOUS) ×2 IMPLANT
CLIP VESOLOCK LG 6/CT PURPLE (CLIP) ×4 IMPLANT
COVER TIP SHEARS 8 DVNC (MISCELLANEOUS) ×1 IMPLANT
COVER TIP SHEARS 8MM DA VINCI (MISCELLANEOUS) ×1
DEFOGGER SCOPE WARMER CLEARIFY (MISCELLANEOUS) ×2 IMPLANT
DERMABOND ADVANCED (GAUZE/BANDAGES/DRESSINGS) ×1
DERMABOND ADVANCED .7 DNX12 (GAUZE/BANDAGES/DRESSINGS) ×1 IMPLANT
DRAPE ARM DVNC X/XI (DISPOSABLE) ×3 IMPLANT
DRAPE COLUMN DVNC XI (DISPOSABLE) ×1 IMPLANT
DRAPE DA VINCI XI ARM (DISPOSABLE) ×3
DRAPE DA VINCI XI COLUMN (DISPOSABLE) ×1
ELECT CAUTERY BLADE TIP 2.5 (TIP) ×2
ELECT REM PT RETURN 9FT ADLT (ELECTROSURGICAL) ×2
ELECTRODE CAUTERY BLDE TIP 2.5 (TIP) ×1 IMPLANT
ELECTRODE REM PT RTRN 9FT ADLT (ELECTROSURGICAL) ×1 IMPLANT
GAUZE 4X4 16PLY ~~LOC~~+RFID DBL (SPONGE) ×2 IMPLANT
GLOVE SURG SYN 6.5 ES PF (GLOVE) ×2 IMPLANT
GLOVE SURG UNDER POLY LF SZ7 (GLOVE) ×4 IMPLANT
GOWN STRL REUS W/ TWL LRG LVL3 (GOWN DISPOSABLE) ×3 IMPLANT
GOWN STRL REUS W/TWL LRG LVL3 (GOWN DISPOSABLE) ×3
IRRIGATOR SUCT 8 DISP DVNC XI (IRRIGATION / IRRIGATOR) IMPLANT
IRRIGATOR SUCTION 8MM XI DISP (IRRIGATION / IRRIGATOR)
IV NS 1000ML (IV SOLUTION)
IV NS 1000ML BAXH (IV SOLUTION) IMPLANT
KIT PINK PAD W/HEAD ARE REST (MISCELLANEOUS) ×2 IMPLANT
KIT PINK PAD W/HEAD ARM REST (MISCELLANEOUS) ×1 IMPLANT
LABEL OR SOLS (LABEL) IMPLANT
MANIFOLD NEPTUNE II (INSTRUMENTS) ×2 IMPLANT
NEEDLE HYPO 22GX1.5 SAFETY (NEEDLE) ×2 IMPLANT
NEEDLE INSUFFLATION 14GA 120MM (NEEDLE) IMPLANT
NS IRRIG 500ML POUR BTL (IV SOLUTION) ×2 IMPLANT
OBTURATOR OPTICAL STANDARD 8MM (TROCAR) ×1
OBTURATOR OPTICAL STND 8 DVNC (TROCAR) ×1
OBTURATOR OPTICALSTD 8 DVNC (TROCAR) ×1 IMPLANT
PACK LAP CHOLECYSTECTOMY (MISCELLANEOUS) ×2 IMPLANT
PENCIL ELECTRO HAND CTR (MISCELLANEOUS) ×2 IMPLANT
RELOAD STAPLER 3.5X45 BLU DVNC (STAPLE) ×1 IMPLANT
SEAL CANN UNIV 5-8 DVNC XI (MISCELLANEOUS) ×2 IMPLANT
SEAL XI 5MM-8MM UNIVERSAL (MISCELLANEOUS) ×2
SEALER VESSEL DA VINCI XI (MISCELLANEOUS) ×1
SEALER VESSEL EXT DVNC XI (MISCELLANEOUS) ×1 IMPLANT
SET TUBE SMOKE EVAC HIGH FLOW (TUBING) ×2 IMPLANT
SOLUTION ELECTROLUBE (MISCELLANEOUS) ×2 IMPLANT
STAPLER 45 DA VINCI SURE FORM (STAPLE) ×1
STAPLER 45 SUREFORM DVNC (STAPLE) ×1 IMPLANT
STAPLER CANNULA SEAL DVNC XI (STAPLE) ×1 IMPLANT
STAPLER CANNULA SEAL XI (STAPLE) ×1
STAPLER RELOAD 3.5X45 BLU DVNC (STAPLE) ×1
STAPLER RELOAD 3.5X45 BLUE (STAPLE) ×1
STRIP CLOSURE SKIN 1/2X4 (GAUZE/BANDAGES/DRESSINGS) ×2 IMPLANT
SUT MNCRL 4-0 (SUTURE) ×1
SUT MNCRL 4-0 27XMFL (SUTURE) ×1
SUT VIC AB 3-0 SH 27 (SUTURE) ×1
SUT VIC AB 3-0 SH 27X BRD (SUTURE) ×1 IMPLANT
SUTURE MNCRL 4-0 27XMF (SUTURE) ×1 IMPLANT
TAPE TRANSPORE STRL 2 31045 (GAUZE/BANDAGES/DRESSINGS) ×2 IMPLANT
TRAY FOLEY MTR SLVR 16FR STAT (SET/KITS/TRAYS/PACK) ×4 IMPLANT

## 2020-09-24 NOTE — Transfer of Care (Signed)
Immediate Anesthesia Transfer of Care Note  Patient: Cheryl Pope  Procedure(s) Performed: XI ROBOTIC LAPAROSCOPIC ASSISTED APPENDECTOMY (Abdomen)  Patient Location: PACU  Anesthesia Type:General  Level of Consciousness: sedated  Airway & Oxygen Therapy: Patient Spontanous Breathing and Patient connected to face mask oxygen  Post-op Assessment: Report given to RN and Post -op Vital signs reviewed and stable  Post vital signs: Reviewed and stable  Last Vitals:  Vitals Value Taken Time  BP 123/74 09/24/20 1615  Temp 36.3 C 09/24/20 1610  Pulse 87 09/24/20 1618  Resp 26 09/24/20 1618  SpO2 83 % 09/24/20 1618  Vitals shown include unvalidated device data.  Last Pain:  Vitals:   09/24/20 1610  TempSrc:   PainSc: Asleep         Complications: No notable events documented.

## 2020-09-24 NOTE — Anesthesia Postprocedure Evaluation (Signed)
Anesthesia Post Note  Patient: Cheryl Pope  Procedure(s) Performed: XI ROBOTIC LAPAROSCOPIC ASSISTED APPENDECTOMY (Abdomen)  Patient location during evaluation: PACU Anesthesia Type: General Level of consciousness: awake and alert Pain management: pain level controlled Vital Signs Assessment: post-procedure vital signs reviewed and stable Respiratory status: spontaneous breathing, nonlabored ventilation, respiratory function stable and patient connected to nasal cannula oxygen Cardiovascular status: blood pressure returned to baseline and stable Postop Assessment: no apparent nausea or vomiting Anesthetic complications: no Comments: Pt complained about slight SOB. IS was given and instructions given. A nebulizer treatment was administered. Pt's saturations improved to >92 on 2L [place]. She discharged to the floor with instructions for pulmonary hygiene with IS.    No notable events documented.   Last Vitals:  Vitals:   09/24/20 1810 09/24/20 1910  BP:  (!) 152/82  Pulse: 89 83  Resp: (!) 23 18  Temp:    SpO2: (!) 88% 99%    Last Pain:  Vitals:   09/24/20 1825  TempSrc:   PainSc: 0-No pain                 Foye Deer

## 2020-09-24 NOTE — Anesthesia Preprocedure Evaluation (Addendum)
Anesthesia Evaluation  Patient identified by MRN, date of birth, ID band Patient awake    Reviewed: Allergy & Precautions, NPO status , Patient's Chart, lab work & pertinent test results  Airway Mallampati: II  TM Distance: >3 FB Neck ROM: Full    Dental no notable dental hx.    Pulmonary neg pulmonary ROS,    Pulmonary exam normal breath sounds clear to auscultation       Cardiovascular Exercise Tolerance: Poor hypertension, Normal cardiovascular exam Rhythm:Regular Rate:Normal     Neuro/Psych PSYCHIATRIC DISORDERS Depression negative neurological ROS     GI/Hepatic negative GI ROS, Neg liver ROS,   Endo/Other  Morbid obesity  Renal/GU negative Renal ROS  negative genitourinary   Musculoskeletal negative musculoskeletal ROS (+)   Abdominal (+) + obese,   Peds  Hematology  (+) anemia ,   Anesthesia Other Findings appendicitis  Reproductive/Obstetrics negative OB ROS                             Anesthesia Physical Anesthesia Plan  ASA: 3  Anesthesia Plan: General ETT   Post-op Pain Management:    Induction: Intravenous  PONV Risk Score and Plan: 3 and Ondansetron, Dexamethasone, Midazolam and Propofol infusion  Airway Management Planned: Oral ETT  Additional Equipment:   Intra-op Plan:   Post-operative Plan: Extubation in OR  Informed Consent: I have reviewed the patients History and Physical, chart, labs and discussed the procedure including the risks, benefits and alternatives for the proposed anesthesia with the patient or authorized representative who has indicated his/her understanding and acceptance.     Dental Advisory Given  Plan Discussed with: Anesthesiologist, CRNA and Surgeon  Anesthesia Plan Comments: (Patient consented for risks of anesthesia including but not limited to:  - adverse reactions to medications - damage to eyes, teeth, lips or other oral  mucosa - nerve damage due to positioning  - sore throat or hoarseness - Damage to heart, brain, nerves, lungs, other parts of body or loss of life  Patient voiced understanding.)        Anesthesia Quick Evaluation

## 2020-09-24 NOTE — Anesthesia Procedure Notes (Signed)
Procedure Name: Intubation Date/Time: 09/24/2020 2:34 PM Performed by: Karoline Caldwell, CRNA Pre-anesthesia Checklist: Patient identified, Patient being monitored, Timeout performed, Emergency Drugs available and Suction available Patient Re-evaluated:Patient Re-evaluated prior to induction Oxygen Delivery Method: Circle system utilized Preoxygenation: Pre-oxygenation with 100% oxygen Induction Type: IV induction Ventilation: Mask ventilation without difficulty Laryngoscope Size: 3 and McGraph Grade View: Grade I Tube type: Oral Tube size: 7.0 mm Number of attempts: 1 Airway Equipment and Method: Stylet and Video-laryngoscopy Placement Confirmation: ETT inserted through vocal cords under direct vision, positive ETCO2 and breath sounds checked- equal and bilateral Secured at: 21 cm Tube secured with: Tape Dental Injury: Teeth and Oropharynx as per pre-operative assessment

## 2020-09-24 NOTE — Consult Note (Signed)
CODE SEPSIS - PHARMACY COMMUNICATION  **Broad Spectrum Antibiotics should be administered within 1 hour of Sepsis diagnosis**  Time Code Sepsis Called/Page Received: 2356  Antibiotics Ordered: 0000  Time of 1st antibiotic administration: 0009  Additional action taken by pharmacy: N/A  If necessary, Name of Provider/Nurse Contacted: N/A  Martyn Malay ,PharmD Clinical Pharmacist  09/24/2020  12:04 AM

## 2020-09-24 NOTE — Progress Notes (Signed)
Pt on 2-4L [place] w/sats 88-96%.  HOB high fowlers, no distress noted but says she feels a "catch" when she tries to take deep breaths.  IS used, encouraged to cough/deep breathe. Duoneb given per order. CXR ordered but then cancelled d/t pt sats improving and staying in the high 90's.  Tx to 347. Report given to Camelia Eng, RN

## 2020-09-24 NOTE — Op Note (Signed)
Operative Note  Robot-assisted laparoscopic Appendectomy   Cheryl Pope Date of operation:  09/24/2020  Indications: The patient presented with a history of  abdominal pain. Workup has revealed findings consistent with acute appendicitis.  Pre-operative Diagnosis: Acute appendicitis without mention of peritonitis  Post-operative Diagnosis: Same  Surgeon: Duanne Guess, MD  Anesthesia: GETA  Findings: Inflamed appendix with localized peritonitis.  No abscess or evidence of perforation  Estimated Blood Loss: Less than 2 cc         Specimens: appendix         Complications: None immediately apparent  Procedure Details  The patient was seen again in the preop area. The options of surgery versus observation were reviewed with the patient and/or family. The risks of bleeding, infection, recurrence of symptoms, negative laparoscopy, potential for an open procedure, bowel injury, abscess or infection, were all reviewed as well. The patient was taken to Operating Room, identified as Cheryl Pope and the procedure verified as laparoscopic appendectomy. A time out was performed and the above information confirmed.  The patient was placed in the supine position and general anesthesia was induced.  Antibiotic prophylaxis was administered and VTE prophylaxis was in place. A Foley catheter was placed by the nursing staff.   The abdomen was prepped and draped in a sterile fashion.  Optiview technique was used to enter the abdomen at the left upper quadrant, just caudal to Palmer's point.  Pneumoperitoneum was achieved without any hemodynamic changes.  A 12 mm port was placed in the midclavicular line just below the costal margin, with a an 8 mm port lateral to that.  An additional 8 mm port was placed in the left lower quadrant.  The patient was placed in 15 degrees of Trendelenburg and tilted to 10 degrees to the left.  The robot was then brought into the field and docked in standard  fashion.  Care was taken to ensure that there were no collisions and that there was adequate space between each of the arms and elbows.  The instruments were introduced under direct vision.  I then scrubbed out and went to the console.  The appendix was identified and found to be acutely inflamed.  The vessel sealer was used to divide the mesoappendix and the appendix was dissected free from the surrounding tissues and elevated off of the base of the cecum. Large Humalog clips were placed across the base of the appendix, squeezing each tightly to squeeze out any edema fluid.  2 were placed on the cecum side and one was placed on the specimen side.  The appendix was then excised using monopolar scissors.  The stump was cauterized.  He appendix was placed in a Endo Catch bag and removed via the 12 mm port. The right lower quadrant was inspected there was no sign of bleeding or bowel injury therefore pneumoperitoneum was released, all ports were removed.  As the 12 mm port was covered by a lobe of liver, I did not close the fascia in this site.  The skin of each site was closed with a deep dermal 3-0 Vicryl and a running subcuticular 4-0 Monocryl.  Dermabond and Steri-Strips were applied.  The patient was awakened, extubated, and taken to the postanesthesia care unit in good condition.  The patient tolerated the procedure well and there were no immediately apparent complications. The sponge lap and needle count were correct at the end of the procedure.  The patient was taken to the recovery room in stable  condition to be admitted for continued care.   Duanne Guess, MD, FACS

## 2020-09-24 NOTE — ED Notes (Signed)
Pt resting in bed at this time. 

## 2020-09-24 NOTE — Sepsis Progress Note (Signed)
Elink following for Sepsis Protocol 

## 2020-09-24 NOTE — H&P (Signed)
Reason for Consult:acute appendicitis Referring Physician: Ward (ED)  Cheryl Pope is an 30 y.o. female.  HPI: She has a history of hypertension, depression, obesity and presents to the emergency department with complaints of nausea and vomiting that started on August 19 at 11 PM.  States at 3 AM on August 20 she started having stabbing right lower quadrant pain without radiation.  States it was hard to walk to the pain was painful with palpation.  No alleviating factors.  Does have a fever here.  No diarrhea, dysuria, hematuria, vaginal bleeding or discharge.  No sick contacts or recent travel.  Has had previous BTL.  Last menstrual period 09/09/2020.  Work up in the ED included labs demonstrating leukocytosis to 21.4K and CT scan of the abdomen/pelvis consistent with acute appendicitis.   Past Medical History:  Diagnosis Date   Anemia    Chronic hypertension    Depression    Pregnancy induced hypertension     Past Surgical History:  Procedure Laterality Date   TONSILLECTOMY     TUBAL LIGATION Bilateral 04/06/2015   Procedure: POST PARTUM TUBAL LIGATION;  Surgeon: Nadara Mustard, MD;  Location: [facility] ORS;  Service: Gynecology;  Laterality: Bilateral;    Family History  Problem Relation Age of Onset   Heart disease Father    Kidney disease Paternal Uncle    Skin cancer Mother    Leukemia Maternal Grandfather    Lung cancer Paternal Grandfather    Colon cancer Paternal Grandfather    Cancer Neg Hx     Social History:  reports that she has never smoked. She has never used smokeless tobacco. She reports that she does not drink alcohol and does not use drugs.  Allergies:  Allergies  Allergen Reactions   Codeine Swelling    Medications: I have reviewed the patient's current medications.  Results for orders placed or performed during the hospital encounter of 09/23/20 (from the past 48 hour(s))  Comprehensive metabolic panel     Status: Abnormal   Collection Time: 09/23/20   8:18 PM  Result Value Ref Range   Sodium 134 (L) 135 - 145 mmol/L   Potassium 3.4 (L) 3.5 - 5.1 mmol/L   Chloride 103 98 - 111 mmol/L   CO2 19 (L) 22 - 32 mmol/L   Glucose, Bld 140 (H) 70 - 99 mg/dL    Comment: Glucose reference range applies only to samples taken after fasting for at least 8 hours.   BUN 9 6 - 20 mg/dL   Creatinine, Ser 5.42 0.44 - 1.00 mg/dL   Calcium 8.5 (L) 8.9 - 10.3 mg/dL   Total Protein 7.1 6.5 - 8.1 g/dL   Albumin 3.8 3.5 - 5.0 g/dL   AST 30 15 - 41 U/L   ALT 23 0 - 44 U/L   Alkaline Phosphatase 98 38 - 126 U/L   Total Bilirubin 1.2 0.3 - 1.2 mg/dL   GFR, Estimated >70 >62 mL/min    Comment: (NOTE) Calculated using the CKD-EPI Creatinine Equation (2021)    Anion gap 12 5 - 15    Comment: Performed at [facility], [address] [place], [place] [address]  Urinalysis, Complete w Microscopic Urine, Clean Catch     Status: Abnormal   Collection Time: 09/23/20  8:18 PM  Result Value Ref Range   Color, Urine YELLOW YELLOW   APPearance CLEAR CLEAR   Specific Gravity, Urine 1.020 1.005 - 1.030   pH 7.0 5.0 - 8.0  Glucose, UA NEGATIVE NEGATIVE mg/dL   Hgb urine dipstick NEGATIVE NEGATIVE   Bilirubin Urine NEGATIVE NEGATIVE   Ketones, ur NEGATIVE NEGATIVE mg/dL   Protein, ur NEGATIVE NEGATIVE mg/dL   Nitrite NEGATIVE NEGATIVE   Leukocytes,Ua NEGATIVE NEGATIVE   RBC / HPF 0-5 0 - 5 RBC/hpf   WBC, UA 0-5 0 - 5 WBC/hpf   Bacteria, UA RARE (A) NONE SEEN   Squamous Epithelial / LPF 0-5 0 - 5   Mucus PRESENT     Comment: Performed at [facility], [address] [place], [place] [address]  Lactic acid, plasma     Status: Abnormal   Collection Time: 09/23/20  8:18 PM  Result Value Ref Range   Lactic Acid, Venous 3.3 (HH) 0.5 - 1.9 mmol/L    Comment: CRITICAL RESULT CALLED TO, READ BACK BY AND VERIFIED WITH [facility] MCIVER AT 2130 09/23/2020 DLB Performed at [facility], [address], [place], [place] [address]   Magnesium      Status: None   Collection Time: 09/23/20  8:18 PM  Result Value Ref Range   Magnesium 1.7 1.7 - 2.4 mg/dL    Comment: Performed at [facility], [address] [place], [place] [address]  Lipase, blood     Status: None   Collection Time: 09/23/20  8:18 PM  Result Value Ref Range   Lipase 22 11 - 51 U/L    Comment: Performed at [facility], [address] [place], [place] [address]  CBC     Status: Abnormal   Collection Time: 09/23/20  8:18 PM  Result Value Ref Range   WBC 22.8 (H) 4.0 - 10.5 K/uL   RBC 4.61 3.87 - 5.11 MIL/uL   Hemoglobin 11.7 (L) 12.0 - 15.0 g/dL   HCT 00.7 62.2 - 63.3 %   MCV 79.6 (L) 80.0 - 100.0 fL   MCH 25.4 (L) 26.0 - 34.0 pg   MCHC 31.9 30.0 - 36.0 g/dL   RDW 35.4 (H) 56.2 - 56.3 %   Platelets 455 (H) 150 - 400 K/uL   nRBC 0.0 0.0 - 0.2 %    Comment: Performed at [facility], [address] [place], [place] [address]  POC Urine Pregnancy, ED  (not at [facility])     Status: None   Collection Time: 09/23/20  8:35 PM  Result Value Ref Range   Preg Test, Ur NEGATIVE NEGATIVE    Comment:        THE SENSITIVITY OF THIS METHODOLOGY IS >24 mIU/mL   Lactic acid, plasma     Status: None   Collection Time: 09/23/20 10:10 PM  Result Value Ref Range   Lactic Acid, Venous 1.9 0.5 - 1.9 mmol/L    Comment: Performed at [facility], [address] [place], [place] [address]  Blood Culture (routine x 2)     Status: None (Preliminary result)   Collection Time: 09/23/20 11:42 PM   Specimen: BLOOD  Result Value Ref Range   Specimen Description BLOOD BLOOD RIGHT ARM    Special Requests      BOTTLES DRAWN AEROBIC AND ANAEROBIC Blood Culture adequate volume   Culture      NO GROWTH < 12 HOURS Performed at [facility], [address] [place], [place] [address]    Report Status PENDING   Resp Panel by RT-PCR (Flu A&B, Covid) Nasopharyngeal Swab     Status: None   Collection Time: 09/24/20 12:10 AM   Specimen:  Nasopharyngeal Swab; Nasopharyngeal(NP) swabs in vial transport medium  Result Value Ref Range   SARS Coronavirus 2 by RT PCR NEGATIVE NEGATIVE    Comment: (NOTE) SARS-CoV-2 target nucleic acids are NOT DETECTED.  The SARS-CoV-2 RNA is generally detectable in upper respiratory specimens during the acute phase of infection. The lowest concentration of SARS-CoV-2 viral copies this assay can detect is 138 copies/mL. A negative result does not preclude SARS-Cov-2 infection and should not be used as the sole basis for treatment or other patient management decisions. A negative result may occur with  improper specimen collection/handling, submission of specimen other than nasopharyngeal swab, presence of viral mutation(s) within the areas targeted by this assay, and inadequate number of viral copies(<138 copies/mL). A negative result must be combined with clinical observations, patient history, and epidemiological information. The expected result is Negative.  Fact Sheet for Patients:  BloggerCourse.comhttps://www.fda.gov/media/152166/download  Fact Sheet for Healthcare Providers:  SeriousBroker.ithttps://www.fda.gov/media/152162/download  This test is no t yet approved or cleared by the [place] FDA and  has been authorized for detection and/or diagnosis of SARS-CoV-2 by FDA under an Emergency Use Authorization (EUA). This EUA will remain  in effect (meaning this test can be used) for the duration of the COVID-19 declaration under Section 564(b)(1) of the Act, 21 U.S.C.section 360bbb-3(b)(1), unless the authorization is terminated  or revoked sooner.       Influenza A by PCR NEGATIVE NEGATIVE   Influenza B by PCR NEGATIVE NEGATIVE    Comment: (NOTE) The Xpert Xpress SARS-CoV-2/FLU/RSV plus assay is intended as an aid in the diagnosis of influenza from Nasopharyngeal swab specimens and should not be used as a sole basis for treatment. Nasal washings and aspirates are unacceptable for Xpert Xpress  SARS-CoV-2/FLU/RSV testing.  Fact Sheet for Patients: BloggerCourse.comhttps://www.fda.gov/media/152166/download  Fact Sheet for Healthcare Providers: SeriousBroker.ithttps://www.fda.gov/media/152162/download  This test is not yet approved or cleared by the [place] FDA and has been authorized for detection and/or diagnosis of SARS-CoV-2 by FDA under an Emergency Use Authorization (EUA). This EUA will remain in effect (meaning this test can be used) for the duration of the COVID-19 declaration under Section 564(b)(1) of the Act, 21 U.S.C. section 360bbb-3(b)(1), unless the authorization is terminated or revoked.  Performed at [facility], [address] [place], [place] [address]   Protime-INR     Status: None   Collection Time: 09/24/20 12:10 AM  Result Value Ref Range   Prothrombin Time 14.4 11.4 - 15.2 seconds   INR 1.1 0.8 - 1.2    Comment: (NOTE) INR goal varies based on device and disease states. Performed at [facility], [address] [place], [place] [address]   APTT     Status: None   Collection Time: 09/24/20 12:10 AM  Result Value Ref Range   aPTT 29 24 - 36 seconds    Comment: Performed at [facility], [address] [place], [place] [address]  Blood Culture (routine x 2)     Status: None (Preliminary result)   Collection Time: 09/24/20 12:10 AM   Specimen: BLOOD  Result Value Ref Range   Specimen Description BLOOD LEFT ASSIST CONTROL    Special Requests      BOTTLES DRAWN AEROBIC AND ANAEROBIC Blood Culture adequate volume   Culture      NO GROWTH < 12 HOURS Performed at [facility], [address] [place], [place] [address]    Report Status PENDING   Basic metabolic panel     Status: Abnormal  Collection Time: 09/24/20  6:55 AM  Result Value Ref Range   Sodium 139 135 - 145 mmol/L   Potassium 3.8 3.5 - 5.1 mmol/L   Chloride 103 98 - 111 mmol/L   CO2 29 22 - 32 mmol/L   Glucose, Bld 125 (H) 70 - 99 mg/dL    Comment: Glucose  reference range applies only to samples taken after fasting for at least 8 hours.   BUN 8 6 - 20 mg/dL   Creatinine, Ser 4.09 0.44 - 1.00 mg/dL   Calcium 8.0 (L) 8.9 - 10.3 mg/dL   GFR, Estimated >81 >19 mL/min    Comment: (NOTE) Calculated using the CKD-EPI Creatinine Equation (2021)    Anion gap 7 5 - 15    Comment: Performed at [facility], [address] [place], [place] [address]  Magnesium     Status: None   Collection Time: 09/24/20  6:55 AM  Result Value Ref Range   Magnesium 2.0 1.7 - 2.4 mg/dL    Comment: Performed at [facility], [address] [place], [place] [address]  Phosphorus     Status: None   Collection Time: 09/24/20  6:55 AM  Result Value Ref Range   Phosphorus 3.9 2.5 - 4.6 mg/dL    Comment: Performed at [facility], [address] [place], [place] [address]  CBC     Status: Abnormal   Collection Time: 09/24/20  6:55 AM  Result Value Ref Range   WBC 21.4 (H) 4.0 - 10.5 K/uL   RBC 4.04 3.87 - 5.11 MIL/uL   Hemoglobin 10.2 (L) 12.0 - 15.0 g/dL   HCT 78.4 (L) 69.6 - 29.5 %   MCV 81.4 80.0 - 100.0 fL   MCH 25.2 (L) 26.0 - 34.0 pg   MCHC 31.0 30.0 - 36.0 g/dL   RDW 28.4 (H) 13.2 - 44.0 %   Platelets 368 150 - 400 K/uL   nRBC 0.0 0.0 - 0.2 %    Comment: Performed at [facility], [address] [place], [place] [address]    CT ABDOMEN PELVIS W CONTRAST  Result Date: 09/24/2020 CLINICAL DATA:  Right lower quadrant abdominal pain, vomiting, fever, shortness of breath since last night. EXAM: CT ABDOMEN AND PELVIS WITH CONTRAST TECHNIQUE: Multidetector CT imaging of the abdomen and pelvis was performed using the standard protocol following bolus administration of intravenous contrast. CONTRAST:  35mL OMNIPAQUE IOHEXOL 350 MG/ML SOLN COMPARISON:  None. FINDINGS: Lower chest: Lung bases are clear. Hepatobiliary: Diffuse fatty infiltration of the liver. No focal lesions. Cholelithiasis with stone in the gallbladder. No  gallbladder wall thickening. No bile duct dilatation. Pancreas: Unremarkable. No pancreatic ductal dilatation or surrounding inflammatory changes. Spleen: Normal in size without focal abnormality. Adrenals/Urinary Tract: No adrenal gland nodules. Several bilateral intrarenal stones. No hydronephrosis or hydroureter. No ureteral stones are demonstrated. Bladder is unremarkable. Stomach/Bowel: Stomach, small bowel, and colon are not abnormally distended. Abnormal appendix with appendiceal diameter measuring 1.3 cm. Stranding around the appendix. Prominent right lower quadrant lymph nodes are likely reactive. Appendix: Location: Right lower quadrant inferior to the cecum Diameter: 13 mm Appendicolith: No Mucosal hyper-enhancement: No Extraluminal gas: No Periappendiceal collection: No Vascular/Lymphatic: No significant vascular findings are present. No enlarged abdominal or pelvic lymph nodes. Reproductive: Uterus and bilateral adnexa are unremarkable. Other: No free air or free fluid in the abdomen. Abdominal wall musculature appears intact. Musculoskeletal: No acute or significant osseous findings. IMPRESSION: 1. Acute appendicitis with appendiceal diameter 1.3 cm and  periappendiceal stranding. No abscess. 2. Multiple bilateral nonobstructing intrarenal stones. 3. Diffuse fatty infiltration of the liver. 4. Cholelithiasis without evidence of cholecystitis. Electronically Signed   By: Burman Nieves M.D.   On: 09/24/2020 00:46    Review of Systems  Gastrointestinal:  Positive for abdominal pain, nausea and vomiting.  All other systems reviewed and are negative. Blood pressure 111/63, pulse 76, temperature 98.5 F (36.9 C), temperature source Oral, resp. rate 13, height  (1.651 m), weight 127 kg, last menstrual period 09/09/2020, SpO2 95 %, unknown if currently breastfeeding. Physical Exam Constitutional:      General: She is not in acute distress.    Appearance: She is obese.  HENT:     Head:  Normocephalic and atraumatic.     Nose:     Comments: Covered with a mask.    Mouth/Throat:     Comments: Covered with a mask. Eyes:     General: No scleral icterus.       Right eye: No discharge.        Left eye: No discharge.  Cardiovascular:     Rate and Rhythm: Normal rate and regular rhythm.  Pulmonary:     Effort: Pulmonary effort is normal. No respiratory distress.  Abdominal:     Palpations: Abdomen is soft.     Tenderness: There is abdominal tenderness. There is no guarding or rebound.  Genitourinary:    Comments: deferred Musculoskeletal:        General: No deformity.  Skin:    General: Skin is warm and dry.  Neurological:     General: No focal deficit present.     Mental Status: She is alert.  Psychiatric:        Mood and Affect: Mood normal.    Assessment/Plan: 30 y/o morbidly obese female with acute appendicitis. Will plan on robot-assisted laparoscopic appendectomy pending OR and anesthesia availability.  Duanne Guess 09/24/2020, 11:22 AM

## 2020-09-25 ENCOUNTER — Encounter: Payer: Self-pay | Admitting: General Surgery

## 2020-09-25 LAB — URINE CULTURE

## 2020-09-25 MED ORDER — TRAMADOL HCL 50 MG PO TABS: 50.0000 mg | ORAL_TABLET | Freq: Four times a day (QID) | ORAL | 0 refills | Status: DC | PRN

## 2020-09-25 MED ORDER — IBUPROFEN 600 MG PO TABS: 600.0000 mg | ORAL_TABLET | Freq: Four times a day (QID) | ORAL | 0 refills | Status: DC | PRN

## 2020-09-25 NOTE — Discharge Summary (Addendum)
[facility] SUR[facility]ICAL DISCHARGE SUMMARY  Patient ID: Cheryl Pope MRN: [id number] DOB/AGE: 1990/05/04 30 y.o.  Admit date: 09/23/2020 Discharge date: 09/25/2020  Discharge Diagnoses Patient Active Problem List   Diagnosis Date Noted   Acute appendicitis with localized peritonitis, without perforation, abscess, or gangrene     Consultants None  Procedures 09/24/2020:  Robotic-Assisted Laparoscopic Appendectomy   HPI: Cheryl Pope is an 30 y.o. female. She has a history of hypertension, depression, obesity and presents to the emergency department with complaints of nausea and vomiting that started on August 19 at 11 PM.  States at 3 AM on August 20 she started having stabbing right lower quadrant pain without radiation.  States it was hard to walk to the pain was painful with palpation.  No alleviating factors.  Does have a fever here.  No diarrhea, dysuria, hematuria, vaginal bleeding or discharge.  No sick contacts or recent travel.  Has had previous BTL.  Last menstrual period 09/09/2020. Work up in the ED included labs demonstrating leukocytosis to 21.4K and CT scan of the abdomen/pelvis consistent with acute appendicitis.   Hospital Course: Informed consent was obtained and documented, and patient underwent uneventful laparoscopic appendectomy (Dr Lady Gary, 09/24/2020).  Post-operatively, patient's pain/symptoms improved/resolved and advancement of patient's diet and ambulation were well-tolerated. The remainder of patient's hospital course was essentially unremarkable, and discharge planning was initiated accordingly with patient safely able to be discharged home with appropriate discharge instructions, pain control, and outpatient follow-up after all of her questions were answered to her expressed satisfaction.   Discharge Condition: Good   Physical Examination:  Constitutional: Well appearing female, NAD Pulmonary: Normal effort, no respiratory  distress Gastrointestinal: Soft, mild incisional soreness, non-distended, no rebound/guarding Skin: Laparoscopic incisions are CDI with steri-strips, no erythema or drainage    Allergies as of 09/25/2020       Reactions   Codeine Swelling        Medication List     TAKE these medications    buPROPion 150 MG 24 hr tablet Commonly known as: WELLBUTRIN XL Take 150 mg by mouth at bedtime.   ferrous sulfate 325 (65 FE) MG tablet Take 325 mg by mouth daily with breakfast.   fluconazole 150 MG tablet Commonly known as: DIFLUCAN Take 150 mg by mouth once.   FLUoxetine 20 MG capsule Commonly known as: PROZAC Take 20 mg by mouth daily.   ibuprofen 600 MG tablet Commonly known as: ADVIL Take 1 tablet (600 mg total) by mouth every 6 (six) hours as needed.   metroNIDAZOLE 500 MG tablet Commonly known as: Flagyl Take 1 tablet (500 mg total) by mouth 2 (two) times daily.   ondansetron 4 MG disintegrating tablet Commonly known as: Zofran ODT Take 1 tablet (4 mg total) by mouth every 8 (eight) hours as needed for nausea or vomiting.   traMADol 50 MG tablet Commonly known as: ULTRAM Take 1 tablet (50 mg total) by mouth every 6 (six) hours as needed.          Follow-up Information     Donovan Kail, PA-C. Schedule an appointment as soon as possible for a visit in 2 week(s).   Specialty: Physician Assistant Why: s/p laparoscopic appendectomy Contact information: [address] [place] [phone]                  Time spent on discharge management including discussion of hospital course, clinical condition, outpatient instructions, prescriptions, and follow up with the patient  and members of the medical team: >30 minutes  -- Lynden Oxford , PA-C [facility]  09/25/2020, 7:51 AM [phone] M-F: 7am - 4pm

## 2020-09-25 NOTE — Progress Notes (Signed)
Patient educated on discharge instructions, medications and follow up appointments. Patient verbalized understanding. Patient will be escorted out by axillary.

## 2020-09-25 NOTE — Discharge Instructions (Signed)
In addition to included general post-operative instructions,  Diet: Resume home diet.   Activity: No heavy lifting >20 pounds (children, pets, laundry, garbage) or strenuous activity until follow-up in 2 weeks, but light activity and walking are encouraged. Do not drive or drink alcohol if taking narcotic pain medications or having pain that might distract from driving.  Wound care: 2 days after surgery (08/23), you may shower/get incision wet with soapy water and pat dry (do not rub incisions), but no baths or submerging incision underwater until follow-up.   Medications: Resume all home medications. For mild to moderate pain: acetaminophen (Tylenol) or ibuprofen/naproxen (if no kidney disease). Combining Tylenol with alcohol can substantially increase your risk of causing liver disease. Narcotic pain medications, if prescribed, can be used for severe pain, though may cause nausea, constipation, and drowsiness. Do not combine Tylenol and Percocet (or similar) within a 6 hour period as Percocet (and similar) contain(s) Tylenol. If you do not need the narcotic pain medication, you do not need to fill the prescription.  Call office ([phone] / [phone]) at any time if any questions, worsening pain, fevers/chills, bleeding, drainage from incision site, or other concerns.  

## 2020-09-26 LAB — SURGICAL PATHOLOGY

## 2020-09-29 LAB — CULTURE, BLOOD (ROUTINE X 2)
Culture: NO GROWTH
Culture: NO GROWTH
Special Requests: ADEQUATE
Special Requests: ADEQUATE

## 2020-10-03 ENCOUNTER — Telehealth: Payer: 59 | Admitting: Emergency Medicine

## 2020-10-03 DIAGNOSIS — H109 Unspecified conjunctivitis: Secondary | ICD-10-CM

## 2020-10-03 MED ORDER — POLYMYXIN B-TRIMETHOPRIM 10000-0.1 UNIT/ML-% OP SOLN: OPHTHALMIC | 0 refills | Status: DC

## 2020-10-03 NOTE — Progress Notes (Signed)
E-Visit for [employer]   We are sorry that you are not feeling well.  Here is how we plan to help!  Based on what you have shared with me it looks like you have conjunctivitis.  Conjunctivitis is a common inflammatory or infectious condition of the eye that is often referred to as "pink eye".  In most cases it is contagious (viral or bacterial). However, not all conjunctivitis requires antibiotics (ex. Allergic).  We have made appropriate suggestions for you based upon your presentation.  I have prescribed Polytrim Ophthalmic drops 1-2 drops 4 times a day times 5 days  Pink eye can be highly contagious.  It is typically spread through direct contact with secretions, or contaminated objects or surfaces that one may have touched.  Strict handwashing is suggested with soap and water is urged.  If not available, use alcohol based had sanitizer.  Avoid unnecessary touching of the eye.  If you wear contact lenses, you will need to refrain from wearing them until you see no white discharge from the eye for at least 24 hours after being on medication.  You should see symptom improvement in 1-2 days after starting the medication regimen.  Call us if symptoms are not improved in 1-2 days.  Home Care: Wash your hands often! Do not wear your contacts until you complete your treatment plan. Avoid sharing towels, bed linen, personal items with a person who has pink eye. See attention for anyone in your home with similar symptoms.  Get Help Right Away If: Your symptoms do not improve. You develop blurred or loss of vision. Your symptoms worsen (increased discharge, pain or redness)   Thank you for choosing an e-visit.  Your e-visit answers were reviewed by a board certified advanced clinical practitioner to complete your personal care plan. Depending upon the condition, your plan could have included both over the counter or prescription medications.  Please review your pharmacy choice. Make sure the  pharmacy is open so you can pick up prescription now. If there is a problem, you may contact your provider through [employer] and have the prescription routed to another pharmacy.  Your safety is important to [place]. If you have drug allergies check your prescription carefully.   For the next 24 hours you can use MyChart to ask questions about today's visit, request a non-urgent call back, or ask for a work or school excuse. You will get an email in the next two days asking about your experience. I hope that your e-visit has been valuable and will speed your recovery.   I have spent 5 minutes in review of e-visit questionnaire, review and updating patient chart, medical decision making and response to patient.   Rica Mast, FNP-BC

## 2020-10-17 ENCOUNTER — Telehealth: Payer: 59 | Admitting: Nurse Practitioner

## 2020-10-17 DIAGNOSIS — B373 Candidiasis of vulva and vagina: Secondary | ICD-10-CM | POA: Diagnosis not present

## 2020-10-17 DIAGNOSIS — B3731 Acute candidiasis of vulva and vagina: Secondary | ICD-10-CM

## 2020-10-17 MED ORDER — FLUCONAZOLE 150 MG PO TABS: 150.0000 mg | ORAL_TABLET | Freq: Once | ORAL | 0 refills | Status: AC

## 2020-10-17 NOTE — Progress Notes (Signed)
E-Visit for Vaginal Symptoms  We are sorry that you are not feeling well. Here is how we plan to help! Based on what you shared with me it looks like you: May have a yeast vaginosis  Vaginosis is an inflammation of the vagina that can result in discharge, itching and pain. The cause is usually a change in the normal balance of vaginal bacteria or an infection. Vaginosis can also result from reduced estrogen levels after menopause.  The most common causes of vaginosis are:   Bacterial vaginosis which results from an overgrowth of one on several organisms that are normally present in your vagina.   Yeast infections which are caused by a naturally occurring fungus called candida.   Vaginal atrophy (atrophic vaginosis) which results from the thinning of the vagina from reduced estrogen levels after menopause.   Trichomoniasis which is caused by a parasite and is commonly transmitted by sexual intercourse.  Factors that increase your risk of developing vaginosis include: Medications, such as antibiotics and steroids Uncontrolled diabetes Use of hygiene products such as bubble bath, vaginal spray or vaginal deodorant Douching Wearing damp or tight-fitting clothing Using an intrauterine device (IUD) for birth control Hormonal changes, such as those associated with pregnancy, birth control pills or menopause Sexual activity Having a sexually transmitted infection  Your treatment plan is A single Diflucan (fluconazole) 150mg tablet once.  I have electronically sent this prescription into the pharmacy that you have chosen.  Be sure to take all of the medication as directed. Stop taking any medication if you develop a rash, tongue swelling or shortness of breath. Mothers who are breast feeding should consider pumping and discarding their breast milk while on these antibiotics. However, there is no consensus that infant exposure at these doses would be harmful.  Remember that medication creams can  weaken latex condoms. .   HOME CARE:  Good hygiene may prevent some types of vaginosis from recurring and may relieve some symptoms:  Avoid baths, hot tubs and whirlpool spas. Rinse soap from your outer genital area after a shower, and dry the area well to prevent irritation. Don't use scented or harsh soaps, such as those with deodorant or antibacterial action. Avoid irritants. These include scented tampons and pads. Wipe from front to back after using the toilet. Doing so avoids spreading fecal bacteria to your vagina.  Other things that may help prevent vaginosis include:  Don't douche. Your vagina doesn't require cleansing other than normal bathing. Repetitive douching disrupts the normal organisms that reside in the vagina and can actually increase your risk of vaginal infection. Douching won't clear up a vaginal infection. Use a latex condom. Both female and female latex condoms may help you avoid infections spread by sexual contact. Wear cotton underwear. Also wear pantyhose with a cotton crotch. If you feel comfortable without it, skip wearing underwear to bed. Yeast thrives in moist environments Your symptoms should improve in the next day or two.  GET HELP RIGHT AWAY IF:  You have pain in your lower abdomen ( pelvic area or over your ovaries) You develop nausea or vomiting You develop a fever Your discharge changes or worsens You have persistent pain with intercourse You develop shortness of breath, a rapid pulse, or you faint.  These symptoms could be signs of problems or infections that need to be evaluated by a medical provider now.  MAKE SURE YOU   Understand these instructions. Will watch your condition. Will get help right away if you are not   doing well or get worse.  Thank you for choosing an e-visit.  Your e-visit answers were reviewed by a board certified advanced clinical practitioner to complete your personal care plan. Depending upon the condition, your plan  could have included both over the counter or prescription medications.  Please review your pharmacy choice. Make sure the pharmacy is open so you can pick up prescription now. If there is a problem, you may contact your provider through MyChart messaging and have the prescription routed to another pharmacy.  Your safety is important to us. If you have drug allergies check your prescription carefully.   For the next 24 hours you can use MyChart to ask questions about today's visit, request a non-urgent call back, or ask for a work or school excuse. You will get an email in the next two days asking about your experience. I hope that your e-visit has been valuable and will speed your recovery.   I spent approximately 7 minutes reviewing the patient's history, current symptoms and coordinating their plan of care today.    Meds ordered this encounter  Medications   fluconazole (DIFLUCAN) 150 MG tablet    Sig: Take 1 tablet (150 mg total) by mouth once for 1 dose.    Dispense:  1 tablet    Refill:  0    

## 2020-10-25 ENCOUNTER — Encounter: Payer: Self-pay | Admitting: General Surgery

## 2020-10-30 ENCOUNTER — Telehealth: Payer: 59 | Admitting: Physician Assistant

## 2020-10-30 DIAGNOSIS — J029 Acute pharyngitis, unspecified: Secondary | ICD-10-CM

## 2020-10-30 MED ORDER — LIDOCAINE VISCOUS HCL 2 % MT SOLN: 5.0000 mL | OROMUCOSAL | 0 refills | Status: DC | PRN

## 2020-10-30 MED ORDER — FLUTICASONE PROPIONATE 50 MCG/ACT NA SUSP: 2.0000 | Freq: Every day | NASAL | 0 refills | Status: DC

## 2020-10-30 NOTE — Progress Notes (Signed)
E-Visit for Sore Throat  We are sorry that you are not feeling well.  Here is how we plan to help!  Your symptoms indicate a likely viral infection (Pharyngitis).   Pharyngitis is inflammation in the back of the throat which can cause a sore throat, scratchiness and sometimes difficulty swallowing.   Pharyngitis is typically caused by a respiratory virus and will just run its course.  Please keep in mind that your symptoms could last up to 10 days.  For throat pain, we recommend over the counter oral pain relief medications such as acetaminophen or aspirin, or anti-inflammatory medications such as ibuprofen or naproxen sodium.  Topical treatments such as oral throat lozenges or sprays may be used as needed.  I will also send in viscous lidocaine for the throat and mouth pain. I will also send in flonase for the ear pain. Avoid close contact with loved ones, especially the very young and elderly.  Remember to wash your hands thoroughly throughout the day as this is the number one way to prevent the spread of infection and wipe down door knobs and counters with disinfectant.  After careful review of your answers, I would not recommend and antibiotic for your condition.  Antibiotics should not be used to treat conditions that we suspect are caused by viruses like the virus that causes the common cold or flu. However, some people can have Strep with atypical symptoms. You may need formal testing in clinic or office to confirm if your symptoms continue or worsen.  Providers prescribe antibiotics to treat infections caused by bacteria. Antibiotics are very powerful in treating bacterial infections when they are used properly.  To maintain their effectiveness, they should be used only when necessary.  Overuse of antibiotics has resulted in the development of super bugs that are resistant to treatment!    Home Care: Only take medications as instructed by your medical team. Do not drink alcohol while taking  these medications. A steam or ultrasonic humidifier can help congestion.  You can place a towel over your head and breathe in the steam from hot water coming from a faucet. Avoid close contacts especially the very young and the elderly. Cover your mouth when you cough or sneeze. Always remember to wash your hands.  Get Help Right Away If: You develop worsening fever or throat pain. You develop a severe head ache or visual changes. Your symptoms persist after you have completed your treatment plan.  Make sure you Understand these instructions. Will watch your condition. Will get help right away if you are not doing well or get worse.   Thank you for choosing an e-visit.  Your e-visit answers were reviewed by a board certified advanced clinical practitioner to complete your personal care plan. Depending upon the condition, your plan could have included both over the counter or prescription medications.  Please review your pharmacy choice. Make sure the pharmacy is open so you can pick up prescription now. If there is a problem, you may contact your provider through [employer] and have the prescription routed to another pharmacy.  Your safety is important to [place]. If you have drug allergies check your prescription carefully.   For the next 24 hours you can use MyChart to ask questions about today's visit, request a non-urgent call back, or ask for a work or school excuse. You will get an email in the next two days asking about your experience. I hope that your e-visit has been valuable and will speed  your recovery.  I provided 5 minutes of non face-to-face time during this encounter for chart review and documentation.

## 2020-11-30 ENCOUNTER — Telehealth: Payer: 59 | Admitting: Physician Assistant

## 2020-11-30 DIAGNOSIS — J208 Acute bronchitis due to other specified organisms: Secondary | ICD-10-CM | POA: Diagnosis not present

## 2020-12-01 MED ORDER — ALBUTEROL SULFATE HFA 108 (90 BASE) MCG/ACT IN AERS: 1.0000 | INHALATION_SPRAY | Freq: Four times a day (QID) | RESPIRATORY_TRACT | 0 refills | Status: DC | PRN

## 2020-12-01 MED ORDER — PSEUDOEPH-BROMPHEN-DM 30-2-10 MG/5ML PO SYRP: 5.0000 mL | ORAL_SOLUTION | Freq: Four times a day (QID) | ORAL | 0 refills | Status: DC | PRN

## 2020-12-01 NOTE — Progress Notes (Signed)
We are sorry that you are not feeling well.  Here is how we plan to help!  Based on your presentation I believe you most likely have A cough due to a virus.  This is called viral bronchitis and is best treated by rest, plenty of fluids and control of the cough.  You may use Ibuprofen or Tylenol as directed to help your symptoms.     In addition you may use Bromfed DM cough syrup 48mL every 6 hours as needed  Albuterol inhaler 1-2 puff every 4-6 hours as needed for cough, shortness of breath, and/or wheezing.  From your responses in the eVisit questionnaire you describe inflammation in the upper respiratory tract which is causing a significant cough.  This is commonly called Bronchitis and has four common causes:   Allergies Viral Infections Acid Reflux Bacterial Infection Allergies, viruses and acid reflux are treated by controlling symptoms or eliminating the cause. An example might be a cough caused by taking certain blood pressure medications. You stop the cough by changing the medication. Another example might be a cough caused by acid reflux. Controlling the reflux helps control the cough.  USE OF BRONCHODILATOR ("RESCUE") INHALERS: There is a risk from using your bronchodilator too frequently.  The risk is that over-reliance on a medication which only relaxes the muscles surrounding the breathing tubes can reduce the effectiveness of medications prescribed to reduce swelling and congestion of the tubes themselves.  Although you feel brief relief from the bronchodilator inhaler, your asthma may actually be worsening with the tubes becoming more swollen and filled with mucus.  This can delay other crucial treatments, such as oral steroid medications. If you need to use a bronchodilator inhaler daily, several times per day, you should discuss this with your provider.  There are probably better treatments that could be used to keep your asthma under control.     HOME CARE Only take medications as  instructed by your medical team. Complete the entire course of an antibiotic. Drink plenty of fluids and get plenty of rest. Avoid close contacts especially the very young and the elderly Cover your mouth if you cough or cough into your sleeve. Always remember to wash your hands A steam or ultrasonic humidifier can help congestion.   GET HELP RIGHT AWAY IF: You develop worsening fever. You become short of breath You cough up blood. Your symptoms persist after you have completed your treatment plan MAKE SURE YOU  Understand these instructions. Will watch your condition. Will get help right away if you are not doing well or get worse.    Thank you for choosing an e-visit.  Your e-visit answers were reviewed by a board certified advanced clinical practitioner to complete your personal care plan. Depending upon the condition, your plan could have included both over the counter or prescription medications.  Please review your pharmacy choice. Make sure the pharmacy is open so you can pick up prescription now. If there is a problem, you may contact your provider through [employer] and have the prescription routed to another pharmacy.  Your safety is important to [place]. If you have drug allergies check your prescription carefully.   For the next 24 hours you can use MyChart to ask questions about today's visit, request a non-urgent call back, or ask for a work or school excuse. You will get an email in the next two days asking about your experience. I hope that your e-visit has been valuable and will speed your recovery.  I provided 6 minutes of non face-to-face time during this encounter for chart review and documentation.

## 2020-12-06 ENCOUNTER — Telehealth: Payer: 59 | Admitting: Physician Assistant

## 2020-12-06 DIAGNOSIS — J208 Acute bronchitis due to other specified organisms: Secondary | ICD-10-CM

## 2020-12-06 DIAGNOSIS — B9689 Other specified bacterial agents as the cause of diseases classified elsewhere: Secondary | ICD-10-CM

## 2020-12-06 MED ORDER — BENZONATATE 100 MG PO CAPS
100.0000 mg | ORAL_CAPSULE | Freq: Three times a day (TID) | ORAL | 0 refills | Status: DC | PRN
Start: 2020-12-06 — End: 2021-02-12

## 2020-12-06 MED ORDER — PREDNISONE 10 MG (21) PO TBPK: ORAL_TABLET | ORAL | 0 refills | Status: DC

## 2020-12-06 MED ORDER — DOXYCYCLINE HYCLATE 100 MG PO TABS: 100.0000 mg | ORAL_TABLET | Freq: Two times a day (BID) | ORAL | 0 refills | Status: DC

## 2020-12-06 NOTE — Progress Notes (Signed)
We are sorry that you are not feeling well.  Here is how we plan to help!  Based on your presentation I believe you most likely have A cough due to bacteria.  When patients have a fever and a productive cough with a change in color or increased sputum production, we are concerned about bacterial bronchitis.  If left untreated it can progress to pneumonia.  If your symptoms do not improve with your treatment plan it is important that you contact your provider.   I have prescribed Doxycycline 100 mg twice a day for 7 days     In addition you may use A prescription cough medication called Tessalon Perles '100mg'$ . You may take 1-2 capsules every 8 hours as needed for your cough.  Prednisone 10 mg daily for 6 days (see taper instructions below)  Directions for 6 day taper: Day 1: 2 tablets before breakfast, 1 after both lunch & dinner and 2 at bedtime Day 2: 1 tab before breakfast, 1 after both lunch & dinner and 2 at bedtime Day 3: 1 tab at each meal & 1 at bedtime Day 4: 1 tab at breakfast, 1 at lunch, 1 at bedtime Day 5: 1 tab at breakfast & 1 tab at bedtime Day 6: 1 tab at breakfast  From your responses in the eVisit questionnaire you describe inflammation in the upper respiratory tract which is causing a significant cough.  This is commonly called Bronchitis and has four common causes:   Allergies Viral Infections Acid Reflux Bacterial Infection Allergies, viruses and acid reflux are treated by controlling symptoms or eliminating the cause. An example might be a cough caused by taking certain blood pressure medications. You stop the cough by changing the medication. Another example might be a cough caused by acid reflux. Controlling the reflux helps control the cough.  USE OF BRONCHODILATOR ("RESCUE") INHALERS: There is a risk from using your bronchodilator too frequently.  The risk is that over-reliance on a medication which only relaxes the muscles surrounding the breathing tubes can reduce  the effectiveness of medications prescribed to reduce swelling and congestion of the tubes themselves.  Although you feel brief relief from the bronchodilator inhaler, your asthma may actually be worsening with the tubes becoming more swollen and filled with mucus.  This can delay other crucial treatments, such as oral steroid medications. If you need to use a bronchodilator inhaler daily, several times per day, you should discuss this with your provider.  There are probably better treatments that could be used to keep your asthma under control.     HOME CARE Only take medications as instructed by your medical team. Complete the entire course of an antibiotic. Drink plenty of fluids and get plenty of rest. Avoid close contacts especially the very young and the elderly Cover your mouth if you cough or cough into your sleeve. Always remember to wash your hands A steam or ultrasonic humidifier can help congestion.   GET HELP RIGHT AWAY IF: You develop worsening fever. You become short of breath You cough up blood. Your symptoms persist after you have completed your treatment plan MAKE SURE YOU  Understand these instructions. Will watch your condition. Will get help right away if you are not doing well or get worse.    Thank you for choosing an e-visit.  Your e-visit answers were reviewed by a board certified advanced clinical practitioner to complete your personal care plan. Depending upon the condition, your plan could have included both over the  counter or prescription medications.  Please review your pharmacy choice. Make sure the pharmacy is open so you can pick up prescription now. If there is a problem, you may contact your provider through [employer] and have the prescription routed to another pharmacy.  Your safety is important to [place]. If you have drug allergies check your prescription carefully.   For the next 24 hours you can use MyChart to ask questions about today's visit,  request a non-urgent call back, or ask for a work or school excuse. You will get an email in the next two days asking about your experience. I hope that your e-visit has been valuable and will speed your recovery.  I provided 5 minutes of non face-to-face time during this encounter for chart review and documentation.

## 2021-01-09 ENCOUNTER — Ambulatory Visit: Payer: 59 | Admitting: Surgery

## 2021-02-12 ENCOUNTER — Telehealth: Payer: 59 | Admitting: Family

## 2021-02-12 DIAGNOSIS — J069 Acute upper respiratory infection, unspecified: Secondary | ICD-10-CM

## 2021-02-12 MED ORDER — BENZONATATE 100 MG PO CAPS: 100.0000 mg | ORAL_CAPSULE | Freq: Three times a day (TID) | ORAL | 0 refills | Status: DC | PRN

## 2021-02-12 MED ORDER — FLUTICASONE PROPIONATE 50 MCG/ACT NA SUSP: 2.0000 | Freq: Every day | NASAL | 6 refills | Status: DC

## 2021-02-12 NOTE — Progress Notes (Signed)

## 2021-02-15 ENCOUNTER — Telehealth: Payer: 59 | Admitting: Physician Assistant

## 2021-02-15 DIAGNOSIS — K0889 Other specified disorders of teeth and supporting structures: Secondary | ICD-10-CM

## 2021-02-15 DIAGNOSIS — K047 Periapical abscess without sinus: Secondary | ICD-10-CM

## 2021-02-15 MED ORDER — IBUPROFEN 600 MG PO TABS: 600.0000 mg | ORAL_TABLET | Freq: Four times a day (QID) | ORAL | 0 refills | Status: DC | PRN

## 2021-02-15 MED ORDER — AMOXICILLIN 500 MG PO CAPS: 500.0000 mg | ORAL_CAPSULE | Freq: Three times a day (TID) | ORAL | 0 refills | Status: AC

## 2021-02-15 NOTE — Progress Notes (Signed)

## 2021-05-25 ENCOUNTER — Telehealth: Payer: 59 | Admitting: Physician Assistant

## 2021-05-25 DIAGNOSIS — L03019 Cellulitis of unspecified finger: Secondary | ICD-10-CM

## 2021-05-25 MED ORDER — CEPHALEXIN 500 MG PO CAPS: 500.0000 mg | ORAL_CAPSULE | Freq: Four times a day (QID) | ORAL | 0 refills | Status: AC

## 2021-05-25 NOTE — Progress Notes (Signed)
I have spent 5 minutes in review of e-visit questionnaire, review and updating patient chart, medical decision making and response to patient.   Kongmeng Santoro Cody Neveen Daponte, PA-C    

## 2021-05-25 NOTE — Progress Notes (Signed)
E Visit for Cellulitis  We are sorry that you are not feeling well. Here is how we plan to help!  Based on what you shared with me it looks like you have cellulitis.  Cellulitis looks like areas of skin redness, swelling, and warmth; it develops as a result of bacteria entering under the skin. Little red spots and/or bleeding can be seen in skin, and tiny surface sacs containing fluid can occur. Fever can be present. Cellulitis is almost always on one side of a body, and the lower limbs are the most common site of involvement.   I have prescribed:  Keflex 500mg take one by mouth four times a day for 5 days  HOME CARE:  Take your medications as ordered and take all of them, even if the skin irritation appears to be healing.   GET HELP RIGHT AWAY IF:  Symptoms that don't begin to go away within 48 hours. Severe redness persists or worsens If the area turns color, spreads or swells. If it blisters and opens, develops yellow-brown crust or bleeds. You develop a fever or chills. If the pain increases or becomes unbearable.  Are unable to keep fluids and food down.  MAKE SURE YOU   Understand these instructions. Will watch your condition. Will get help right away if you are not doing well or get worse.  Thank you for choosing an e-visit.  Your e-visit answers were reviewed by a board certified advanced clinical practitioner to complete your personal care plan. Depending upon the condition, your plan could have included both over the counter or prescription medications.  Please review your pharmacy choice. Make sure the pharmacy is open so you can pick up prescription now. If there is a problem, you may contact your provider through MyChart messaging and have the prescription routed to another pharmacy.  Your safety is important to us. If you have drug allergies check your prescription carefully.   For the next 24 hours you can use MyChart to ask questions about today's visit, request a  non-urgent call back, or ask for a work or school excuse. You will get an email in the next two days asking about your experience. I hope that your e-visit has been valuable and will speed your recovery.  

## 2021-06-02 ENCOUNTER — Telehealth: Payer: 59 | Admitting: Nurse Practitioner

## 2021-06-02 DIAGNOSIS — B3731 Acute candidiasis of vulva and vagina: Secondary | ICD-10-CM | POA: Diagnosis not present

## 2021-06-02 MED ORDER — FLUCONAZOLE 150 MG PO TABS: 150.0000 mg | ORAL_TABLET | Freq: Once | ORAL | 0 refills | Status: AC

## 2021-06-02 NOTE — Progress Notes (Signed)

## 2021-06-29 ENCOUNTER — Encounter: Payer: Self-pay | Admitting: Obstetrics

## 2021-07-24 ENCOUNTER — Telehealth: Payer: 59 | Admitting: Physician Assistant

## 2021-07-24 DIAGNOSIS — B3731 Acute candidiasis of vulva and vagina: Secondary | ICD-10-CM | POA: Diagnosis not present

## 2021-07-24 MED ORDER — FLUCONAZOLE 150 MG PO TABS: 150.0000 mg | ORAL_TABLET | Freq: Once | ORAL | 0 refills | Status: AC

## 2021-07-24 NOTE — Progress Notes (Signed)
I have spent 5 minutes in review of e-visit questionnaire, review and updating patient chart, medical decision making and response to patient.   Aspasia Rude Cody Nicasio Barlowe, PA-C    

## 2021-07-24 NOTE — Progress Notes (Signed)

## 2021-07-26 ENCOUNTER — Telehealth: Payer: 59 | Admitting: Physician Assistant

## 2021-07-26 DIAGNOSIS — K047 Periapical abscess without sinus: Secondary | ICD-10-CM

## 2021-07-27 MED ORDER — IBUPROFEN 600 MG PO TABS: 600.0000 mg | ORAL_TABLET | Freq: Three times a day (TID) | ORAL | 0 refills | Status: DC | PRN

## 2021-07-27 MED ORDER — AMOXICILLIN 500 MG PO CAPS: 500.0000 mg | ORAL_CAPSULE | Freq: Three times a day (TID) | ORAL | 0 refills | Status: AC

## 2021-08-09 ENCOUNTER — Encounter: Payer: Self-pay | Admitting: Obstetrics

## 2021-08-18 ENCOUNTER — Telehealth: Payer: 59 | Admitting: Family Medicine

## 2021-08-18 DIAGNOSIS — B3731 Acute candidiasis of vulva and vagina: Secondary | ICD-10-CM | POA: Diagnosis not present

## 2021-08-18 MED ORDER — FLUCONAZOLE 150 MG PO TABS: 150.0000 mg | ORAL_TABLET | Freq: Once | ORAL | 0 refills | Status: AC

## 2021-08-18 NOTE — Progress Notes (Signed)

## 2021-09-14 ENCOUNTER — Telehealth: Payer: 59 | Admitting: Physician Assistant

## 2021-09-14 DIAGNOSIS — N760 Acute vaginitis: Secondary | ICD-10-CM | POA: Diagnosis not present

## 2021-09-14 DIAGNOSIS — B9689 Other specified bacterial agents as the cause of diseases classified elsewhere: Secondary | ICD-10-CM | POA: Diagnosis not present

## 2021-09-14 MED ORDER — METRONIDAZOLE 500 MG PO TABS: 500.0000 mg | ORAL_TABLET | Freq: Two times a day (BID) | ORAL | 0 refills | Status: AC

## 2021-09-14 NOTE — Progress Notes (Signed)

## 2021-10-03 ENCOUNTER — Inpatient Hospital Stay: Payer: 59 | Attending: Oncology | Admitting: Oncology

## 2021-10-03 ENCOUNTER — Encounter: Payer: Self-pay | Admitting: Oncology

## 2021-10-03 ENCOUNTER — Inpatient Hospital Stay: Payer: 59

## 2021-10-03 VITALS — BP 128/78 | HR 94 | Temp 97.6°F | Resp 18 | Wt 238.0 lb

## 2021-10-03 DIAGNOSIS — Z840 Family history of diseases of the skin and subcutaneous tissue: Secondary | ICD-10-CM | POA: Diagnosis not present

## 2021-10-03 DIAGNOSIS — Z806 Family history of leukemia: Secondary | ICD-10-CM | POA: Diagnosis not present

## 2021-10-03 DIAGNOSIS — Z800 Family history of malignant neoplasm of digestive organs: Secondary | ICD-10-CM | POA: Diagnosis not present

## 2021-10-03 DIAGNOSIS — D72829 Elevated white blood cell count, unspecified: Secondary | ICD-10-CM

## 2021-10-03 DIAGNOSIS — L739 Follicular disorder, unspecified: Secondary | ICD-10-CM | POA: Diagnosis not present

## 2021-10-03 DIAGNOSIS — D509 Iron deficiency anemia, unspecified: Secondary | ICD-10-CM

## 2021-10-03 DIAGNOSIS — D649 Anemia, unspecified: Secondary | ICD-10-CM | POA: Diagnosis not present

## 2021-10-03 DIAGNOSIS — Z801 Family history of malignant neoplasm of trachea, bronchus and lung: Secondary | ICD-10-CM | POA: Diagnosis not present

## 2021-10-03 DIAGNOSIS — Z809 Family history of malignant neoplasm, unspecified: Secondary | ICD-10-CM | POA: Diagnosis not present

## 2021-10-03 DIAGNOSIS — D75839 Thrombocytosis, unspecified: Secondary | ICD-10-CM

## 2021-10-03 LAB — HEPATITIS PANEL, ACUTE
HCV Ab: NONREACTIVE
Hep A IgM: NONREACTIVE
Hep B C IgM: NONREACTIVE
Hepatitis B Surface Ag: NONREACTIVE

## 2021-10-03 LAB — CBC WITH DIFFERENTIAL/PLATELET
Abs Immature Granulocytes: 0.04 10*3/uL (ref 0.00–0.07)
Basophils Absolute: 0.1 10*3/uL (ref 0.0–0.1)
Basophils Relative: 0 %
Eosinophils Absolute: 0.1 10*3/uL (ref 0.0–0.5)
Eosinophils Relative: 1 %
HCT: 36.4 % (ref 36.0–46.0)
Hemoglobin: 11.4 g/dL — ABNORMAL LOW (ref 12.0–15.0)
Immature Granulocytes: 0 %
Lymphocytes Relative: 31 %
Lymphs Abs: 4.1 10*3/uL — ABNORMAL HIGH (ref 0.7–4.0)
MCH: 25.2 pg — ABNORMAL LOW (ref 26.0–34.0)
MCHC: 31.3 g/dL (ref 30.0–36.0)
MCV: 80.5 fL (ref 80.0–100.0)
Monocytes Absolute: 0.5 10*3/uL (ref 0.1–1.0)
Monocytes Relative: 4 %
Neutro Abs: 8.5 10*3/uL — ABNORMAL HIGH (ref 1.7–7.7)
Neutrophils Relative %: 64 %
Platelets: 453 10*3/uL — ABNORMAL HIGH (ref 150–400)
RBC: 4.52 MIL/uL (ref 3.87–5.11)
RDW: 15.4 % (ref 11.5–15.5)
WBC: 13.3 10*3/uL — ABNORMAL HIGH (ref 4.0–10.5)
nRBC: 0 % (ref 0.0–0.2)

## 2021-10-03 LAB — VITAMIN B12: Vitamin B-12: 402 pg/mL (ref 180–914)

## 2021-10-03 LAB — FERRITIN: Ferritin: 9 ng/mL — ABNORMAL LOW (ref 11–307)

## 2021-10-03 LAB — IRON AND TIBC
Iron: 29 ug/dL (ref 28–170)
Saturation Ratios: 8 % — ABNORMAL LOW (ref 10.4–31.8)
TIBC: 358 ug/dL (ref 250–450)
UIBC: 329 ug/dL

## 2021-10-03 LAB — RETIC PANEL
Immature Retic Fract: 14.1 % (ref 2.3–15.9)
RBC.: 4.54 MIL/uL (ref 3.87–5.11)
Retic Count, Absolute: 65.8 10*3/uL (ref 19.0–186.0)
Retic Ct Pct: 1.5 % (ref 0.4–3.1)
Reticulocyte Hemoglobin: 27.2 pg — ABNORMAL LOW (ref >27.9)

## 2021-10-03 LAB — FOLATE: Folate: 15.5 ng/mL (ref >5.9)

## 2021-10-03 LAB — LACTATE DEHYDROGENASE: LDH: 109 U/L (ref 98–192)

## 2021-10-03 MED ORDER — MUPIROCIN CALCIUM 2 % EX CREA: 1.0000 | TOPICAL_CREAM | Freq: Three times a day (TID) | CUTANEOUS | 0 refills | Status: DC

## 2021-10-03 NOTE — Assessment & Plan Note (Addendum)
Check CBC, iron panel, B12, folate.  Iron panel is consistent with iron deficiency.  She is already taking oral iron supplementation. We discussed about IV Venofer treatment options. I discussed about option of continue oral iron supplementation and repeat blood work for evaluation of treatment response.  If no significant improvement, then proceed with IV Venofer treatments. Alternative option of proceed with IV Venofer treatments. I discussed about the potential risks including but not limited to allergic reactions/infusion reactions including anaphylactic reactions, phlebitis, high blood pressure, wheezing, SOB, skin rash, weight gain, leg swelling, headache, nausea and fatigue, etc.  Recommend IV Venofer weekly x3

## 2021-10-03 NOTE — Progress Notes (Addendum)
Hematology/Oncology Progress note [phone] [phone]            Patient Care Team: Tracie Harrier, MD as PCP - General (Internal Medicine) Earlie Server, MD as Consulting Physician (Oncology)  REFERRING PROVIDER: Tracie Harrier, MD   CHIEF COMPLAINTS/REASON FOR VISIT:  Establish care history of leukocytosis, iron deficiency anemia.  HISTORY OF PRESENTING ILLNESS:   Cheryl Pope is a  31 y.o.  female with PMH listed below was seen in consultation at the request of  Hande, Cherlyn Labella, MD to reestablish care for history of leukocytosis, iron deficiency anemia.  Patient previously followed up by Dr.Corcoran, patient switched care to me on 10/03/21 Extensive medical record review was performed by me Patient was last seen by Dr. Mike Gip on 07/15/2019.  Chronic leukocytosis since 2011.  Of note, previous work-up showed negative ESR, CRP, ANA, RF, negative BCR-ABL1 by FISH, leukocytosis was felt to be reactive.  History of iron deficiency.  MCV was low in the past.  Etiology was felt to be secondary to intermittent rectal bleeding/menses.  Patient takes oral iron supplementation.  Patient has significantly history of colon cancer, breast cancer, leukemia.  Today patient reports feeling well.  She has felt intermittently neck no swelling.  Recently she finished her course of antibiotics prescribed by primary care provider. Denies any unintentional weight loss, night sweats, fever.  Patient has been on Wagovy for several months and has successfully intentionally lost weight.  Patient denies any recent steroid intake or drug injection. Denies any prostatic joints, implants She takes Niferex 150 mg daily.   MEDICAL HISTORY:  Past Medical History:  Diagnosis Date   Anemia    Chronic hypertension    Depression    Pregnancy induced hypertension     SURGICAL HISTORY: Past Surgical History:  Procedure Laterality Date   TONSILLECTOMY     TUBAL  LIGATION Bilateral 04/06/2015   Procedure: POST PARTUM TUBAL LIGATION;  Surgeon: Gae Dry, MD;  Location: [facility];  Service: Gynecology;  Laterality: Bilateral;   XI ROBOTIC LAPAROSCOPIC ASSISTED APPENDECTOMY N/A 09/24/2020   Procedure: XI ROBOTIC LAPAROSCOPIC ASSISTED APPENDECTOMY;  Surgeon: Fredirick Maudlin, MD;  Location: [facility];  Service: General;  Laterality: N/A;    SOCIAL HISTORY: Social History   Socioeconomic History   Marital status: Significant Other    Spouse name: Not on file   Number of children: 2   Years of education: Not on file   Highest education level: Not on file  Occupational History   Occupation: Surveyor, quantity: [employer]  Tobacco Use   Smoking status: Never   Smokeless tobacco: Never  Substance and Sexual Activity   Alcohol use: No   Drug use: No   Sexual activity: Yes    Birth control/protection: Surgical  Other Topics Concern   Not on file  Social History Narrative   Not on file   Social Determinants of Health   Financial Resource Strain: Not on file  Food Insecurity: Not on file  Transportation Needs: Not on file  Physical Activity: Not on file  Stress: Not on file  Social Connections: Not on file  Intimate Partner Violence: Not on file    FAMILY HISTORY: Family History  Problem Relation Age of Onset   Skin cancer Mother    Heart disease Father    Breast cancer Maternal Grandmother    Leukemia Maternal Grandfather    Breast cancer Paternal Grandmother    Lung cancer Paternal Grandfather    Colon cancer  Paternal Grandfather    Kidney disease Paternal Uncle    Cancer Neg Hx     ALLERGIES:  is allergic to codeine.  MEDICATIONS:  Current Outpatient Medications  Medication Sig Dispense Refill   iron polysaccharides (NIFEREX) 150 MG capsule Take 150 mg by mouth daily.     mupirocin cream (BACTROBAN) 2 % Apply 1 Application topically 3 (three) times daily. 30 g 0   WEGOVY 2.4 MG/0.75ML SOAJ Inject into the skin.     No  current facility-administered medications for this visit.    Review of Systems  Constitutional:  Negative for chills, fatigue and fever.  HENT:   Negative for hearing loss and voice change.   Eyes:  Negative for eye problems.  Respiratory:  Negative for chest tightness, cough and shortness of breath.   Cardiovascular:  Negative for chest pain.  Gastrointestinal:  Negative for abdominal distention, abdominal pain and blood in stool.  Endocrine: Negative for hot flashes.  Genitourinary:  Negative for difficulty urinating and frequency.   Musculoskeletal:  Negative for arthralgias.  Skin:  Negative for itching and rash.  Neurological:  Negative for extremity weakness.  Hematological:  Negative for adenopathy.  Psychiatric/Behavioral:  Negative for confusion.    PHYSICAL EXAMINATION: ECOG PERFORMANCE STATUS: 0 - Asymptomatic Vitals:   10/03/21 1349  BP: 128/78  Pulse: 94  Resp: 18  Temp: 97.6 F (36.4 C)   Filed Weights   10/03/21 1349  Weight: 238 lb (108 kg)    Physical Exam Constitutional:      General: She is not in acute distress.    Appearance: She is obese.  HENT:     Head: Normocephalic and atraumatic.  Eyes:     General: No scleral icterus. Cardiovascular:     Rate and Rhythm: Normal rate and regular rhythm.     Heart sounds: Normal heart sounds.     Comments: Soft murmur Pulmonary:     Effort: Pulmonary effort is normal. No respiratory distress.     Breath sounds: No wheezing.  Abdominal:     General: Bowel sounds are normal. There is no distension.     Palpations: Abdomen is soft.  Musculoskeletal:        General: No deformity. Normal range of motion.     Cervical back: Normal range of motion and neck supple.  Skin:    General: Skin is warm and dry.     Findings: No erythema or rash.  Neurological:     Mental Status: She is alert and oriented to person, place, and time. Mental status is at baseline.  Psychiatric:        Mood and Affect: Mood normal.      LABORATORY DATA:  I have reviewed the data as listed    Latest Ref Rng & Units 10/03/2021    2:48 PM 09/24/2020    6:55 AM 09/23/2020    8:18 PM  CBC  WBC 4.0 - 10.5 K/uL 13.3  21.4  22.8   Hemoglobin 12.0 - 15.0 g/dL 11.4  10.2  11.7   Hematocrit 36.0 - 46.0 % 36.4  32.9  36.7   Platelets 150 - 400 K/uL 453  368  455       Latest Ref Rng & Units 09/24/2020    6:55 AM 09/23/2020    8:18 PM 04/04/2015    9:42 PM  CMP  Glucose 70 - 99 mg/dL 125  140  94   BUN 6 - 20 mg/dL 8  9  9   Creatinine 0.44 - 1.00 mg/dL 0.70  0.86  0.79   Sodium 135 - 145 mmol/L 139  134  136   Potassium 3.5 - 5.1 mmol/L 3.8  3.4  3.7   Chloride 98 - 111 mmol/L 103  103  106   CO2 22 - 32 mmol/L 29  19  21    Calcium 8.9 - 10.3 mg/dL 8.0  8.5  8.4   Total Protein 6.5 - 8.1 g/dL  7.1  6.1   Total Bilirubin 0.3 - 1.2 mg/dL  1.2  0.3   Alkaline Phos 38 - 126 U/L  98  149   AST 15 - 41 U/L  30  15   ALT 0 - 44 U/L  23  11       RADIOGRAPHIC STUDIES: I have personally reviewed the radiological images as listed and agreed with the findings in the report. No results found.     ASSESSMENT & PLAN:   Leukocytosis Reviewed and discussed with patient. Leukocytosis, predominantly with failure.  Likely reactive etiology Check CBC, smear, multiple myeloma panel, light chain ratio, peripheral blood flow cytometry, ANA, acute hepatitis panel,  Iron deficiency anemia Check CBC, iron panel, B12, folate.  Iron panel is consistent with iron deficiency.  She is already taking oral iron supplementation. We discussed about IV Venofer treatment options. I discussed about option of continue oral iron supplementation and repeat blood work for evaluation of treatment response.  If no significant improvement, then proceed with IV Venofer treatments. Alternative option of proceed with IV Venofer treatments. I discussed about the potential risks including but not limited to allergic reactions/infusion reactions  including anaphylactic reactions, phlebitis, high blood pressure, wheezing, SOB, skin rash, weight gain, leg swelling, headache, nausea and fatigue, etc.  Recommend IV Venofer weekly x3   Thrombocytosis Chronic, likely reactive to inflammation or iron deficiency.  Family history of cancer Recommend genetic counseling evaluation for genetic testing.  She agrees.  Will refer.   Orders Placed This Encounter  Procedures   Ferritin    Standing Status:   Future    Number of Occurrences:   1    Standing Expiration Date:   04/04/2022   Folate    Standing Status:   Future    Number of Occurrences:   1    Standing Expiration Date:   10/04/2022   Vitamin B12    Standing Status:   Future    Number of Occurrences:   1    Standing Expiration Date:   10/04/2022   Iron and TIBC    Standing Status:   Future    Number of Occurrences:   1    Standing Expiration Date:   10/04/2022   CBC with Differential/Platelet    Standing Status:   Future    Number of Occurrences:   1    Standing Expiration Date:   10/04/2022   Retic Panel    Standing Status:   Future    Number of Occurrences:   1    Standing Expiration Date:   10/04/2022   Multiple Myeloma Panel (SPEP&IFE w/QIG)    Standing Status:   Future    Number of Occurrences:   1    Standing Expiration Date:   10/04/2022   Kappa/lambda light chains    Standing Status:   Future    Number of Occurrences:   1    Standing Expiration Date:   10/04/2022   Flow cytometry panel-leukemia/lymphoma work-up  Standing Status:   Future    Number of Occurrences:   1    Standing Expiration Date:   10/04/2022   ANA, IFA (with reflex)    Standing Status:   Future    Number of Occurrences:   1    Standing Expiration Date:   10/04/2022   Lactate dehydrogenase    Standing Status:   Future    Number of Occurrences:   1    Standing Expiration Date:   10/04/2022   JAK2 V617F rfx CALR/MPL/E12-15    Standing Status:   Future    Number of Occurrences:   1    Standing  Expiration Date:   10/04/2022   Hepatitis panel, acute    Standing Status:   Future    Number of Occurrences:   1    Standing Expiration Date:   10/04/2022   Ambulatory referral to Genetics    Referral Priority:   Routine    Referral Type:   Consultation    Referral Reason:   Specialty Services Required    Number of Visits Requested:   1    All questions were answered. The patient knows to call the clinic with any problems, questions or concerns.  Tracie Harrier, MD   Return of visit: 1 year lab MD:   Earlie Server, MD, PhD [facility] Hematology Oncology 10/03/2021

## 2021-10-03 NOTE — Assessment & Plan Note (Signed)
Recommend genetic counseling evaluation for genetic testing.  She agrees.  Will refer.

## 2021-10-03 NOTE — Assessment & Plan Note (Signed)
Chronic, likely reactive to inflammation or iron deficiency.

## 2021-10-03 NOTE — Assessment & Plan Note (Signed)
Reviewed and discussed with patient. Leukocytosis, predominantly with failure.  Likely reactive etiology Check CBC, smear, multiple myeloma panel, light chain ratio, peripheral blood flow cytometry, ANA, acute hepatitis panel,

## 2021-10-03 NOTE — Progress Notes (Signed)
Patient here today for visit regarding leukocytosis. Patient has been seen for this issue in the past. She reports fatigue, had swollen lymph nodes in neck but have improved after completing Amoxicillin.

## 2021-10-04 ENCOUNTER — Ambulatory Visit: Payer: 59 | Admitting: Obstetrics

## 2021-10-04 ENCOUNTER — Encounter: Payer: Self-pay | Admitting: Obstetrics

## 2021-10-04 ENCOUNTER — Other Ambulatory Visit (HOSPITAL_COMMUNITY)
Admission: RE | Admit: 2021-10-04 | Discharge: 2021-10-04 | Disposition: A | Discharge status: Home / Self Care | Payer: 59 | Source: Ambulatory Visit | Attending: Obstetrics | Admitting: Obstetrics

## 2021-10-04 VITALS — BP 120/81 | HR 80 | Ht 67.0 in | Wt 238.5 lb

## 2021-10-04 DIAGNOSIS — Z124 Encounter for screening for malignant neoplasm of cervix: Secondary | ICD-10-CM

## 2021-10-04 DIAGNOSIS — Z113 Encounter for screening for infections with a predominantly sexual mode of transmission: Secondary | ICD-10-CM

## 2021-10-04 DIAGNOSIS — L989 Disorder of the skin and subcutaneous tissue, unspecified: Secondary | ICD-10-CM

## 2021-10-04 DIAGNOSIS — Z01419 Encounter for gynecological examination (general) (routine) without abnormal findings: Secondary | ICD-10-CM

## 2021-10-04 LAB — KAPPA/LAMBDA LIGHT CHAINS
Kappa free light chain: 23.3 mg/L — ABNORMAL HIGH (ref 3.3–19.4)
Kappa, lambda light chain ratio: 0.95 (ref 0.26–1.65)
Lambda free light chains: 24.6 mg/L (ref 5.7–26.3)

## 2021-10-04 LAB — ANTINUCLEAR ANTIBODIES, IFA: ANA Ab, IFA: NEGATIVE

## 2021-10-04 NOTE — Progress Notes (Signed)
SUBJECTIVE  HPI  Cheryl Pope is a 31 y.o.-year-old female who presents for an annual gynecological exam and Pap smear today. She has no health concerns today and reports normal monthly periods. She denies abnormal vaginal bleeding, pelvic pain, and UTI symptoms. She does report frequent yeast infections. She is being followed by hematology for anemia and leukocytosis. She has recently lost 60 lbs. Due to her extensive family cancer history, she will be having genetic screening done to help guide her routine cancer screening recommendations. She is sexually active with one female partner. She had a BTL after the birth of her younger child.  Medical/Surgical History Past Medical History:  Diagnosis Date   Anemia    Chronic hypertension    Depression    Pregnancy induced hypertension    Past Surgical History:  Procedure Laterality Date   TONSILLECTOMY     TUBAL LIGATION Bilateral 04/06/2015   Procedure: POST PARTUM TUBAL LIGATION;  Surgeon: Nadara Mustard, MD;  Location: [facility];  Service: Gynecology;  Laterality: Bilateral;   XI ROBOTIC LAPAROSCOPIC ASSISTED APPENDECTOMY N/A 09/24/2020   Procedure: XI ROBOTIC LAPAROSCOPIC ASSISTED APPENDECTOMY;  Surgeon: Duanne Guess, MD;  Location: [facility];  Service: General;  Laterality: N/A;    Social History Lives with two children and her boyfriend Work: shift lead at [employer] Exercise: 45 min-1 hour at least 5x/week Substances: EtOH: 2 beers/month; denies tobacco, vape, and recreational drugs  Obstetric History OB History     Gravida  2   Para  1   Term  1   Preterm  0   AB  0   Living  2      SAB  0   IAB  0   Ectopic  0   Multiple  0   Live Births               GYN/Menstrual History Patient's last menstrual period was 09/25/2021. regular periods every month Last Pap: 5-6 years ago Contraception: BTL  Prevention Endorses regular eye exams. Has dental exam scheduled. Mammogram: at 36 or per genetic  counselor recommendation Planning colonoscopy  Current Medications Outpatient Medications Prior to Visit  Medication Sig   iron polysaccharides (NIFEREX) 150 MG capsule Take 150 mg by mouth daily.   WEGOVY 2.4 MG/0.75ML SOAJ Inject into the skin.   mupirocin cream (BACTROBAN) 2 % Apply 1 Application topically 3 (three) times daily. (Patient not taking: Reported on 10/04/2021)   No facility-administered medications prior to visit.      Upstream - 10/04/21 1312       Pregnancy Intention Screening   Does the patient want to become pregnant in the next year? No    Does the patient's partner want to become pregnant in the next year? No    Would the patient like to discuss contraceptive options today? No      Contraception Wrap Up   Current Method Female Sterilization    End Method Female Sterilization    Contraception Counseling Provided No            The pregnancy intention screening data noted above was reviewed. Potential methods of contraception were discussed. The patient elected to proceed with Female Sterilization.   ROS History obtained from the patient General ROS: positive for  - fatigue negative for - chills, fever, hot flashes, malaise, or night sweats Psychological ROS: negative for - anxiety or depression Ophthalmic ROS: negative for - blurry vision or decreased vision ENT ROS: negative for - headaches or  sore throat Hematological and Lymphatic ROS: positive for - bruising and fatigue negative for - swollen lymph nodes Endocrine ROS: negative for - breast changes, mood swings, palpitations, or polydipsia/polyuria Breast ROS: negative for breast lumps Respiratory ROS: no cough, shortness of breath, or wheezing Cardiovascular ROS: no chest pain or dyspnea on exertion Gastrointestinal ROS: no abdominal pain, change in bowel habits, or black or bloody stools Genito-Urinary ROS: no dysuria, trouble voiding, or hematuria Dermatological ROS: negative      No data  to display           OBJECTIVE  Last Weight  Most recent update: 10/04/2021 10:02 AM    Weight  108.2 kg (238 lb 8 oz)             Body mass index is 37.35 kg/m.    BP 120/81   Pulse 80   Ht 5\' 7"  (1.702 m)   Wt 238 lb 8 oz (108.2 kg)   LMP 09/25/2021   BMI 37.35 kg/m  General appearance: alert, cooperative, and appears stated age Head: Normocephalic, without obvious abnormality, atraumatic Eyes: negative findings: lids and lashes normal and conjunctivae and sclerae normal Neck: no adenopathy, supple, symmetrical, trachea midline, and thyroid not enlarged, symmetric, no tenderness/mass/nodules Lungs: clear to auscultation bilaterally Breasts: normal appearance, no masses or tenderness, Inspection negative, No nipple retraction or dimpling, No nipple discharge or bleeding, No axillary or supraclavicular adenopathy, Normal to palpation without dominant masses, Taught monthly breast self examination Heart: regular rate and rhythm, S1, S2 normal, no murmur, click, rub or gallop Abdomen: soft, non-tender; bowel sounds normal; no masses,  no organomegaly Pelvic: cervix normal in appearance, external genitalia normal, no adnexal masses or tenderness, no cervical motion tenderness, rectovaginal septum normal, uterus normal size, shape, and consistency, vagina normal without discharge, and Pap collected. Contact bleeding noted. Extremities: extremities normal, atraumatic, no cyanosis or edema Pulses: 2+ and symmetric Skin:  Skin normal texture, color, and turgor. No rashes. On left inner thigh, round, raised bump about 3 cm with wart-like growths noted.  Lymph nodes: Cervical, supraclavicular, and axillary nodes normal.  ASSESSMENT  1) Annual exam 2) Due for Pap 3) Skin lesion on left thigh  PLAN 1) Physical exam as noted. STI swabs and blood work done. Declines routine lab work. Reviewed healthy lifestyle choices, routine screening, and prevention of yeast infections. 2) Pap  collected. F/u based on results. 3) Referral to dermatology to assess and treat skin lesion.  Return in one year for annual exam or as needed for concerns.   09/27/2021, CNM

## 2021-10-05 LAB — RPR: RPR Ser Ql: NONREACTIVE

## 2021-10-05 LAB — HIV ANTIBODY (ROUTINE TESTING W REFLEX): HIV Screen 4th Generation wRfx: NONREACTIVE

## 2021-10-08 LAB — COMP PANEL: LEUKEMIA/LYMPHOMA

## 2021-10-09 ENCOUNTER — Telehealth: Payer: Self-pay

## 2021-10-09 NOTE — Telephone Encounter (Signed)
Pt informed via mychart. Please schedule IV venofer weekly x3 and inform pt of appts. First dose will be new.

## 2021-10-09 NOTE — Telephone Encounter (Signed)
-----   Message from Rickard Patience, MD sent at 10/06/2021  3:04 PM EDT ----- Iron levels are low. Please arrange her to get IV venofer weekly x 3.  Keep same follow up appt.

## 2021-10-10 LAB — MULTIPLE MYELOMA PANEL, SERUM
Albumin SerPl Elph-Mcnc: 3.6 g/dL (ref 2.9–4.4)
Albumin/Glob SerPl: 1.2 (ref 0.7–1.7)
Alpha 1: 0.2 g/dL (ref 0.0–0.4)
Alpha2 Glob SerPl Elph-Mcnc: 0.8 g/dL (ref 0.4–1.0)
B-Globulin SerPl Elph-Mcnc: 1 g/dL (ref 0.7–1.3)
Gamma Glob SerPl Elph-Mcnc: 1.2 g/dL (ref 0.4–1.8)
Globulin, Total: 3.2 g/dL (ref 2.2–3.9)
IgA: 212 mg/dL (ref 87–352)
IgG (Immunoglobin G), Serum: 1247 mg/dL (ref 586–1602)
IgM (Immunoglobulin M), Srm: 126 mg/dL (ref 26–217)
Total Protein ELP: 6.8 g/dL (ref 6.0–8.5)

## 2021-10-10 LAB — CALR +MPL + E12-E15  (REFLEX)

## 2021-10-10 LAB — JAK2 V617F RFX CALR/MPL/E12-15

## 2021-10-11 ENCOUNTER — Encounter: Payer: Self-pay | Admitting: Obstetrics

## 2021-10-11 LAB — CYTOLOGY - PAP
Chlamydia: NEGATIVE
Comment: NEGATIVE
Comment: NEGATIVE
Comment: NEGATIVE
Comment: NEGATIVE
Comment: NEGATIVE
Comment: NORMAL
Diagnosis: NEGATIVE
HPV 16: NEGATIVE
HPV 18 / 45: NEGATIVE
High risk HPV: POSITIVE — AB
Neisseria Gonorrhea: NEGATIVE
Trichomonas: NEGATIVE

## 2021-10-12 ENCOUNTER — Telehealth: Payer: Self-pay

## 2021-10-12 DIAGNOSIS — D72829 Elevated white blood cell count, unspecified: Secondary | ICD-10-CM

## 2021-10-12 NOTE — Telephone Encounter (Signed)
-----   Message from Rickard Patience, MD sent at 10/11/2021 11:09 PM EDT ----- Please let her know that one of blood work showed possible monoclonal protein, I recommend to repeat testing in 4 months.  Please add appt. - lab in 4 months, order iron labs + myeloma panel 1 week prior to MD + Venofer.

## 2021-10-12 NOTE — Telephone Encounter (Signed)
Spoke to pt and notified her of MD recommendations.   Please schedule labs in 4 months, then MD/ venofer 1 week AFTER labs.   Pt will pick up AVS when she comes for iron infusion.

## 2021-10-16 ENCOUNTER — Inpatient Hospital Stay: Payer: 59 | Attending: Oncology

## 2021-10-16 VITALS — BP 122/79 | HR 88 | Temp 97.8°F | Resp 18

## 2021-10-16 DIAGNOSIS — D509 Iron deficiency anemia, unspecified: Secondary | ICD-10-CM | POA: Diagnosis present

## 2021-10-16 MED ORDER — SODIUM CHLORIDE 0.9 % IV SOLN
200.0000 mg | Freq: Once | INTRAVENOUS | Status: AC
  Administered 2021-10-16: 200 mg via INTRAVENOUS
  Filled 2021-10-16: qty 200

## 2021-10-16 MED ORDER — SODIUM CHLORIDE 0.9 % IV SOLN
Freq: Once | INTRAVENOUS | Status: AC
  Filled 2021-10-16: qty 250

## 2021-10-23 ENCOUNTER — Inpatient Hospital Stay: Payer: 59

## 2021-10-24 ENCOUNTER — Inpatient Hospital Stay: Payer: 59

## 2021-10-24 VITALS — BP 110/74 | HR 84 | Temp 98.4°F | Resp 18

## 2021-10-24 DIAGNOSIS — D509 Iron deficiency anemia, unspecified: Secondary | ICD-10-CM | POA: Diagnosis not present

## 2021-10-24 MED ORDER — SODIUM CHLORIDE 0.9 % IV SOLN
Freq: Once | INTRAVENOUS | Status: AC
  Filled 2021-10-24: qty 250

## 2021-10-24 MED ORDER — SODIUM CHLORIDE 0.9 % IV SOLN
200.0000 mg | Freq: Once | INTRAVENOUS | Status: AC
  Administered 2021-10-24: 200 mg via INTRAVENOUS
  Filled 2021-10-24: qty 200

## 2021-10-30 ENCOUNTER — Inpatient Hospital Stay: Payer: 59

## 2021-10-30 ENCOUNTER — Inpatient Hospital Stay: Payer: 59 | Admitting: Licensed Clinical Social Worker

## 2021-10-30 ENCOUNTER — Other Ambulatory Visit: Payer: 59

## 2021-10-30 ENCOUNTER — Encounter: Payer: Self-pay | Admitting: Licensed Clinical Social Worker

## 2021-10-30 VITALS — BP 124/65 | HR 83 | Temp 97.5°F | Resp 17

## 2021-10-30 DIAGNOSIS — Z800 Family history of malignant neoplasm of digestive organs: Secondary | ICD-10-CM

## 2021-10-30 DIAGNOSIS — D509 Iron deficiency anemia, unspecified: Secondary | ICD-10-CM

## 2021-10-30 DIAGNOSIS — Z803 Family history of malignant neoplasm of breast: Secondary | ICD-10-CM

## 2021-10-30 MED ORDER — SODIUM CHLORIDE 0.9 % IV SOLN
Freq: Once | INTRAVENOUS | Status: AC
  Filled 2021-10-30: qty 250

## 2021-10-30 MED ORDER — SODIUM CHLORIDE 0.9 % IV SOLN
200.0000 mg | Freq: Once | INTRAVENOUS | Status: AC
  Administered 2021-10-30: 200 mg via INTRAVENOUS
  Filled 2021-10-30: qty 200

## 2021-10-30 NOTE — Patient Instructions (Signed)
[facility]  Discharge Instructions: Thank you for choosing [facility] to provide your oncology and hematology care.  If you have a lab appointment with the Cancer Center, please go directly to the Cancer Center and check in at the registration area.  Wear comfortable clothing and clothing appropriate for easy access to any Portacath or PICC line.   We strive to give you quality time with your provider. You may need to reschedule your appointment if you arrive late (15 or more minutes).  Arriving late affects you and other patients whose appointments are after yours.  Also, if you miss three or more appointments without notifying the office, you may be dismissed from the clinic at the provider's discretion.      For prescription refill requests, have your pharmacy contact our office and allow 72 hours for refills to be completed.    Today you received the following chemotherapy and/or immunotherapy agents venofer      To help prevent nausea and vomiting after your treatment, we encourage you to take your nausea medication as directed.  BELOW ARE SYMPTOMS THAT SHOULD BE REPORTED IMMEDIATELY: *FEVER GREATER THAN 100.4 F (38 C) OR HIGHER *CHILLS OR SWEATING *NAUSEA AND VOMITING THAT IS NOT CONTROLLED WITH YOUR NAUSEA MEDICATION *UNUSUAL SHORTNESS OF BREATH *UNUSUAL BRUISING OR BLEEDING *URINARY PROBLEMS (pain or burning when urinating, or frequent urination) *BOWEL PROBLEMS (unusual diarrhea, constipation, pain near the anus) TENDERNESS IN MOUTH AND THROAT WITH OR WITHOUT PRESENCE OF ULCERS (sore throat, sores in mouth, or a toothache) UNUSUAL RASH, SWELLING OR PAIN  UNUSUAL VAGINAL DISCHARGE OR ITCHING   Items with * indicate a potential emergency and should be followed up as soon as possible or go to the Emergency Department if any problems should occur.  Please show the CHEMOTHERAPY ALERT CARD or IMMUNOTHERAPY ALERT CARD at check-in to the  Emergency Department and triage nurse.  Should you have questions after your visit or need to cancel or reschedule your appointment, please contact [facility]  [phone] and follow the prompts.  Office hours are 8:00 a.m. to 4:30 p.m. Monday - Friday. Please note that voicemails left after 4:00 p.m. may not be returned until the following business day.  We are closed weekends and major holidays. You have access to a nurse at all times for urgent questions. Please call the main number to the clinic [phone] and follow the prompts.  For any non-urgent questions, you may also contact your provider using MyChart. We now offer e-Visits for anyone 18 and older to request care online for non-urgent symptoms. For details visit [email or web address].   Also download the MyChart app! Go to the app store, search "MyChart", open the app, select [facility], and log in with your MyChart username and password.  Masks are optional in the cancer centers. If you would like for your care team to wear a mask while they are taking care of you, please let them know. For doctor visits, patients may have with them one support person who is at least 33 years old. At this time, visitors are not allowed in the infusion area.   

## 2021-10-30 NOTE — Progress Notes (Signed)
REFERRING PROVIDER: Earlie Server, MD [place],  [place] [address]  PRIMARY PROVIDER:  Tracie Harrier, MD  PRIMARY REASON FOR VISIT:  1. Family history of breast cancer   2. Family history of colon cancer      HISTORY OF PRESENT ILLNESS:   [name], a 31 y.o. female, was seen for a [facility] cancer genetics consultation at the request of Dr. Tasia Catchings due to a family history of cancer.  [name] presents to clinic today to discuss the possibility of a hereditary predisposition to cancer, genetic testing, and to further clarify her future cancer risks, as well as potential cancer risks for family members.   [name] is a 31 y.o. female with no personal history of cancer.    RISK FACTORS:  Menarche was at age 66.  First live birth at age 50.  OCP use:: yes Ovaries intact: yes.  Hysterectomy: no.  Menopausal status: premenopausal.  HRT use: 0 years. Colonoscopy: no; not examined. Mammogram within the last year: no. Number of breast biopsies: 0.  Past Medical History:  Diagnosis Date   Anemia    Chronic hypertension    Depression    Pregnancy induced hypertension     Past Surgical History:  Procedure Laterality Date   TONSILLECTOMY     TUBAL LIGATION Bilateral 04/06/2015   Procedure: POST PARTUM TUBAL LIGATION;  Surgeon: Gae Dry, MD;  Location: [facility] ORS;  Service: Gynecology;  Laterality: Bilateral;   XI ROBOTIC LAPAROSCOPIC ASSISTED APPENDECTOMY N/A 09/24/2020   Procedure: XI ROBOTIC LAPAROSCOPIC ASSISTED APPENDECTOMY;  Surgeon: Fredirick Maudlin, MD;  Location: [facility] ORS;  Service: General;  Laterality: N/A;    FAMILY HISTORY:  We obtained a detailed, 4-generation family history.  Significant diagnoses are listed below: Family History  Problem Relation Age of Onset   Skin cancer Mother    Heart disease Father    Kidney disease Paternal Uncle    Breast cancer Maternal Grandmother 66   Leukemia Maternal Grandfather    Breast cancer Paternal  Grandmother 57   Cervical cancer Paternal Grandmother        dx 16s   Lung cancer Paternal Grandfather 20   Colon cancer Paternal Grandfather 4   Cancer Neg Hx    [name] has 1 son, 36, and 1 daughter, 6. She has 1 maternal half brother and 1 paternal half sister.   [name]'s mother is living at 61 and has had skin cancer on her shoulder and face, unsure type. Maternal grandmother has breast cancer currently at 38. Maternal grandfather had lymphoma x3 and leukemia and passed at 23.   [name]'s father is living at 77. Patient's paternal grandmother had cervical cancer at 22 and breast cancer at 30, her father had colon cancer. Paternal grandfather had colon cancer at 53 and lung cancer a 39.  [name] is unaware of previous family history of genetic testing for hereditary cancer risks. There is no reported Ashkenazi Jewish ancestry. There is no known consanguinity.    GENETIC COUNSELING ASSESSMENT: [name] is a 31 y.o. female with a family history of breast and colon cancer which is somewhat suggestive of a hereditary cancer syndrome and predisposition to cancer. We, therefore, discussed and recommended the following at today's visit.   DISCUSSION: We discussed that approximately 10% of  cancer is hereditary. Most cases of hereditary breast cancer are associated with BRCA1/BRCA2 genes, although there are other genes associated with hereditary cancer as well. Cancers and risks are  gene specific. We discussed that testing is beneficial for several reasons including knowing about cancer risks, identifying potential screening and risk-reduction options that may be appropriate, and to understand if other family members could be at risk for cancer and allow them to undergo genetic testing.   We reviewed the characteristics, features and inheritance patterns of hereditary cancer syndromes. We also discussed genetic testing, including the appropriate family members to test, the  process of testing, insurance coverage and turn-around-time for results. We discussed the implications of a negative, positive and/or variant of uncertain significant result.  Based on [name]'s family history of cancer, she does not medical criteria for genetic testing. She may have an out of pocket cost of  $250. Although she does not meet criteria, she could still pursue testing through the [facility] Common Hereditary Cancers panel if she is interested.  PLAN: After considering the risks, benefits, and limitations,  [name] did not wish to pursue genetic testing at today's visit. We understand this decision and remain available to coordinate genetic testing at any time in the future. We, therefore, recommend [name] continue to follow the cancer screening guidelines given by her primary healthcare provider.  [name]'s questions were answered to her satisfaction today. Our contact information was provided should additional questions or concerns arise. Thank you for the referral and allowing [place] to share in the care of your patient.   Cheryl Rogue, MS, [facility] Genetic Counselor [email or web address] Phone: [phone]  The patient was seen for a total of 12 minutes in face-to-face genetic counseling.  Dr. Grayland Ormond was available for discussion regarding this case.   _______________________________________________________________________ For Office Staff:  Number of people involved in session: 2 Was an Intern/ student involved with case: no

## 2021-11-21 ENCOUNTER — Telehealth: Payer: 59 | Admitting: Emergency Medicine

## 2021-11-21 DIAGNOSIS — J069 Acute upper respiratory infection, unspecified: Secondary | ICD-10-CM

## 2021-11-21 MED ORDER — SPACER/AERO-HOLDING CHAMBERS DEVI: 1.0000 | 0 refills | Status: DC | PRN

## 2021-11-21 MED ORDER — ALBUTEROL SULFATE HFA 108 (90 BASE) MCG/ACT IN AERS: 2.0000 | INHALATION_SPRAY | Freq: Four times a day (QID) | RESPIRATORY_TRACT | 0 refills | Status: DC | PRN

## 2021-11-21 NOTE — Progress Notes (Signed)
We are sorry that you are not feeling well.  Here is how we plan to help!  Based on your presentation I believe you most likely have A cough due to a virus.  This is called viral bronchitis and is best treated by rest, plenty of fluids and control of the cough.  You may use Ibuprofen or Tylenol as directed to help your symptoms.     In addition you may use A prescription cough medication called Tessalon Perles 100mg . You may take 1-2 capsules every 8 hours as needed for your cough.  Since you mention you are wheezing, I have also prescribed an inhaler for you to try with a spacer. If you have never used an inhaler or a spacer before, ask the pharmacy to show you how to use it properly.   If this treatment plan doesn't help you feel better, please be seen in-person such as at an urgent care.   From your responses in the eVisit questionnaire you describe inflammation in the upper respiratory tract which is causing a significant cough.  This is commonly called Bronchitis and has four common causes:   Allergies Viral Infections Acid Reflux Bacterial Infection Allergies, viruses and acid reflux are treated by controlling symptoms or eliminating the cause. An example might be a cough caused by taking certain blood pressure medications. You stop the cough by changing the medication. Another example might be a cough caused by acid reflux. Controlling the reflux helps control the cough.  USE OF BRONCHODILATOR ("RESCUE") INHALERS: There is a risk from using your bronchodilator too frequently.  The risk is that over-reliance on a medication which only relaxes the muscles surrounding the breathing tubes can reduce the effectiveness of medications prescribed to reduce swelling and congestion of the tubes themselves.  Although you feel brief relief from the bronchodilator inhaler, your asthma may actually be worsening with the tubes becoming more swollen and filled with mucus.  This can delay other crucial  treatments, such as oral steroid medications. If you need to use a bronchodilator inhaler daily, several times per day, you should discuss this with your provider.  There are probably better treatments that could be used to keep your asthma under control.     HOME CARE Only take medications as instructed by your medical team. Complete the entire course of an antibiotic. Drink plenty of fluids and get plenty of rest. Avoid close contacts especially the very young and the elderly Cover your mouth if you cough or cough into your sleeve. Always remember to wash your hands A steam or ultrasonic humidifier can help congestion.   GET HELP RIGHT AWAY IF: You develop worsening fever. You become short of breath You cough up blood. Your symptoms persist after you have completed your treatment plan MAKE SURE YOU  Understand these instructions. Will watch your condition. Will get help right away if you are not doing well or get worse.    Thank you for choosing an e-visit.  Your e-visit answers were reviewed by a board certified advanced clinical practitioner to complete your personal care plan. Depending upon the condition, your plan could have included both over the counter or prescription medications.  Please review your pharmacy choice. Make sure the pharmacy is open so you can pick up prescription now. If there is a problem, you may contact your provider through and have the prescription routed to another pharmacy.  Your safety is important to [employer]. If you have drug allergies check your prescription  carefully.   For the next 24 hours you can use MyChart to ask questions about today's visit, request a non-urgent call back, or ask for a work or school excuse. You will get an email in the next two days asking about your experience. I hope that your e-visit has been valuable and will speed your recovery.  I have spent 5 minutes in review of e-visit questionnaire, review and updating  patient chart, medical decision making and response to patient.   Willeen Cass, PhD, FNP-BC

## 2021-11-23 MED ORDER — BENZONATATE 100 MG PO CAPS: 100.0000 mg | ORAL_CAPSULE | Freq: Two times a day (BID) | ORAL | 0 refills | Status: DC | PRN

## 2021-11-23 NOTE — Addendum Note (Signed)
Addended by: Carvel Getting on: 11/23/2021 01:17 PM   Modules accepted: Orders

## 2021-12-31 ENCOUNTER — Encounter: Payer: Self-pay | Admitting: Oncology

## 2021-12-31 ENCOUNTER — Other Ambulatory Visit: Payer: Self-pay

## 2021-12-31 DIAGNOSIS — Z803 Family history of malignant neoplasm of breast: Secondary | ICD-10-CM

## 2021-12-31 DIAGNOSIS — D649 Anemia, unspecified: Secondary | ICD-10-CM

## 2021-12-31 DIAGNOSIS — D72829 Elevated white blood cell count, unspecified: Secondary | ICD-10-CM

## 2022-01-02 ENCOUNTER — Inpatient Hospital Stay: Payer: 59 | Attending: Oncology

## 2022-01-02 DIAGNOSIS — D649 Anemia, unspecified: Secondary | ICD-10-CM

## 2022-01-02 DIAGNOSIS — D509 Iron deficiency anemia, unspecified: Secondary | ICD-10-CM | POA: Insufficient documentation

## 2022-01-02 LAB — CBC WITH DIFFERENTIAL/PLATELET
Abs Immature Granulocytes: 0.02 10*3/uL (ref 0.00–0.07)
Basophils Absolute: 0.1 10*3/uL (ref 0.0–0.1)
Basophils Relative: 1 %
Eosinophils Absolute: 0.2 10*3/uL (ref 0.0–0.5)
Eosinophils Relative: 2 %
HCT: 38.7 % (ref 36.0–46.0)
Hemoglobin: 12.2 g/dL (ref 12.0–15.0)
Immature Granulocytes: 0 %
Lymphocytes Relative: 43 %
Lymphs Abs: 4.5 10*3/uL — ABNORMAL HIGH (ref 0.7–4.0)
MCH: 26.5 pg (ref 26.0–34.0)
MCHC: 31.5 g/dL (ref 30.0–36.0)
MCV: 83.9 fL (ref 80.0–100.0)
Monocytes Absolute: 0.5 10*3/uL (ref 0.1–1.0)
Monocytes Relative: 4 %
Neutro Abs: 5.3 10*3/uL (ref 1.7–7.7)
Neutrophils Relative %: 50 %
Platelets: 370 10*3/uL (ref 150–400)
RBC: 4.61 MIL/uL (ref 3.87–5.11)
RDW: 15.1 % (ref 11.5–15.5)
WBC: 10.6 10*3/uL — ABNORMAL HIGH (ref 4.0–10.5)
nRBC: 0 % (ref 0.0–0.2)

## 2022-01-02 LAB — FERRITIN: Ferritin: 35 ng/mL (ref 11–307)

## 2022-01-02 LAB — IRON AND TIBC
Iron: 50 ug/dL (ref 28–170)
Saturation Ratios: 17 % (ref 10.4–31.8)
TIBC: 302 ug/dL (ref 250–450)
UIBC: 252 ug/dL

## 2022-02-11 ENCOUNTER — Inpatient Hospital Stay: Payer: 59 | Attending: Oncology

## 2022-02-15 MED FILL — Iron Sucrose Inj 20 MG/ML (Fe Equiv): INTRAVENOUS | Qty: 10 | Status: AC

## 2022-02-18 ENCOUNTER — Inpatient Hospital Stay: Payer: 59

## 2022-02-18 ENCOUNTER — Inpatient Hospital Stay: Payer: 59 | Admitting: Oncology

## 2022-03-07 ENCOUNTER — Telehealth: Payer: 59 | Admitting: Family Medicine

## 2022-03-07 DIAGNOSIS — B3731 Acute candidiasis of vulva and vagina: Secondary | ICD-10-CM | POA: Diagnosis not present

## 2022-03-07 MED ORDER — FLUCONAZOLE 150 MG PO TABS: 150.0000 mg | ORAL_TABLET | Freq: Every day | ORAL | 0 refills | Status: DC

## 2022-03-07 NOTE — Progress Notes (Signed)

## 2022-04-26 ENCOUNTER — Inpatient Hospital Stay: Payer: 59 | Attending: Oncology

## 2022-04-26 DIAGNOSIS — L739 Follicular disorder, unspecified: Secondary | ICD-10-CM

## 2022-04-26 DIAGNOSIS — D75839 Thrombocytosis, unspecified: Secondary | ICD-10-CM

## 2022-04-26 DIAGNOSIS — D509 Iron deficiency anemia, unspecified: Secondary | ICD-10-CM | POA: Diagnosis present

## 2022-04-26 DIAGNOSIS — Z809 Family history of malignant neoplasm, unspecified: Secondary | ICD-10-CM

## 2022-04-26 DIAGNOSIS — D72829 Elevated white blood cell count, unspecified: Secondary | ICD-10-CM

## 2022-04-26 LAB — CBC WITH DIFFERENTIAL/PLATELET
Abs Immature Granulocytes: 0.03 10*3/uL (ref 0.00–0.07)
Basophils Absolute: 0.1 10*3/uL (ref 0.0–0.1)
Basophils Relative: 1 %
Eosinophils Absolute: 0.2 10*3/uL (ref 0.0–0.5)
Eosinophils Relative: 2 %
HCT: 37.9 % (ref 36.0–46.0)
Hemoglobin: 12.1 g/dL (ref 12.0–15.0)
Immature Granulocytes: 0 %
Lymphocytes Relative: 33 %
Lymphs Abs: 4.1 10*3/uL — ABNORMAL HIGH (ref 0.7–4.0)
MCH: 27.3 pg (ref 26.0–34.0)
MCHC: 31.9 g/dL (ref 30.0–36.0)
MCV: 85.6 fL (ref 80.0–100.0)
Monocytes Absolute: 0.6 10*3/uL (ref 0.1–1.0)
Monocytes Relative: 4 %
Neutro Abs: 7.6 10*3/uL (ref 1.7–7.7)
Neutrophils Relative %: 60 %
Platelets: 392 10*3/uL (ref 150–400)
RBC: 4.43 MIL/uL (ref 3.87–5.11)
RDW: 13.7 % (ref 11.5–15.5)
WBC: 12.5 10*3/uL — ABNORMAL HIGH (ref 4.0–10.5)
nRBC: 0 % (ref 0.0–0.2)

## 2022-04-26 LAB — COMPREHENSIVE METABOLIC PANEL
ALT: 14 U/L (ref 0–44)
AST: 12 U/L — ABNORMAL LOW (ref 15–41)
Albumin: 4.3 g/dL (ref 3.5–5.0)
Alkaline Phosphatase: 51 U/L (ref 38–126)
Anion gap: 6 (ref 5–15)
BUN: 13 mg/dL (ref 6–20)
CO2: 24 mmol/L (ref 22–32)
Calcium: 9.2 mg/dL (ref 8.9–10.3)
Chloride: 104 mmol/L (ref 98–111)
Creatinine, Ser: 0.75 mg/dL (ref 0.44–1.00)
GFR, Estimated: >60 mL/min (ref >60)
Glucose, Bld: 92 mg/dL (ref 70–99)
Potassium: 4.2 mmol/L (ref 3.5–5.1)
Sodium: 134 mmol/L — ABNORMAL LOW (ref 135–145)
Total Bilirubin: 0.5 mg/dL (ref 0.3–1.2)
Total Protein: 7.7 g/dL (ref 6.5–8.1)

## 2022-04-26 LAB — FERRITIN: Ferritin: 18 ng/mL (ref 11–307)

## 2022-04-26 LAB — RETIC PANEL
Immature Retic Fract: 8 % (ref 2.3–15.9)
RBC.: 4.43 MIL/uL (ref 3.87–5.11)
Retic Count, Absolute: 66.5 10*3/uL (ref 19.0–186.0)
Retic Ct Pct: 1.5 % (ref 0.4–3.1)
Reticulocyte Hemoglobin: 29.5 pg (ref >27.9)

## 2022-04-26 LAB — IRON AND TIBC
Iron: 32 ug/dL (ref 28–170)
Saturation Ratios: 9 % — ABNORMAL LOW (ref 10.4–31.8)
TIBC: 356 ug/dL (ref 250–450)
UIBC: 324 ug/dL

## 2022-04-29 ENCOUNTER — Telehealth: Payer: Self-pay | Admitting: Oncology

## 2022-04-29 ENCOUNTER — Inpatient Hospital Stay: Payer: 59 | Admitting: Oncology

## 2022-04-29 ENCOUNTER — Inpatient Hospital Stay: Payer: 59

## 2022-04-29 LAB — KAPPA/LAMBDA LIGHT CHAINS
Kappa free light chain: 16.3 mg/L (ref 3.3–19.4)
Kappa, lambda light chain ratio: 0.77 (ref 0.26–1.65)
Lambda free light chains: 21.1 mg/L (ref 5.7–26.3)

## 2022-04-29 NOTE — Telephone Encounter (Signed)
This patient called to request her visit be changed to virtual. It is scheduled for today. Please advise. Thank you

## 2022-05-01 LAB — MULTIPLE MYELOMA PANEL, SERUM
Albumin SerPl Elph-Mcnc: 4.1 g/dL (ref 2.9–4.4)
Albumin/Glob SerPl: 1.5 (ref 0.7–1.7)
Alpha 1: 0.2 g/dL (ref 0.0–0.4)
Alpha2 Glob SerPl Elph-Mcnc: 0.7 g/dL (ref 0.4–1.0)
B-Globulin SerPl Elph-Mcnc: 1 g/dL (ref 0.7–1.3)
Gamma Glob SerPl Elph-Mcnc: 0.9 g/dL (ref 0.4–1.8)
Globulin, Total: 2.8 g/dL (ref 2.2–3.9)
IgA: 228 mg/dL (ref 87–352)
IgG (Immunoglobin G), Serum: 1060 mg/dL (ref 586–1602)
IgM (Immunoglobulin M), Srm: 126 mg/dL (ref 26–217)
Total Protein ELP: 6.9 g/dL (ref 6.0–8.5)

## 2022-05-10 ENCOUNTER — Inpatient Hospital Stay: Payer: 59 | Attending: Oncology | Admitting: Oncology

## 2022-05-10 ENCOUNTER — Encounter: Payer: Self-pay | Admitting: Oncology

## 2022-05-10 DIAGNOSIS — D509 Iron deficiency anemia, unspecified: Secondary | ICD-10-CM | POA: Diagnosis not present

## 2022-05-10 DIAGNOSIS — D72829 Elevated white blood cell count, unspecified: Secondary | ICD-10-CM | POA: Diagnosis not present

## 2022-05-10 NOTE — Progress Notes (Addendum)
HEMATOLOGY-ONCOLOGY TeleHEALTH VISIT PROGRESS NOTE  I connected with Cheryl Pope on 05/10/22  at 12:00 PM EDT by video enabled telemedicine visit and verified that I am speaking with the correct person using two identifiers. I discussed the limitations, risks, security and privacy concerns of performing an evaluation and management service by telemedicine and the availability of in-person appointments. The patient expressed understanding and agreed to proceed.   Other persons participating in the visit and their role in the encounter:  None  Patient's location: at work Provider's location: office Chief Complaint: History of leukocytosis and iron deficiency anemia   INTERVAL HISTORY Cheryl Pope is a 32 y.o. female who has above history reviewed by me today presents for follow up visit  Patient reports feeling well.  Some fatigue.  Menstrual period has been having for recent 3 months. No other new complaints.  Review of Systems  Constitutional:  Positive for fatigue. Negative for appetite change, chills and fever.  HENT:   Negative for hearing loss and voice change.   Eyes:  Negative for eye problems.  Respiratory:  Negative for chest tightness and cough.   Cardiovascular:  Negative for chest pain.  Gastrointestinal:  Negative for abdominal distention, abdominal pain and blood in stool.  Endocrine: Negative for hot flashes.  Genitourinary:  Negative for difficulty urinating and frequency.   Musculoskeletal:  Negative for arthralgias.  Skin:  Negative for itching and rash.  Neurological:  Negative for extremity weakness.  Hematological:  Negative for adenopathy.  Psychiatric/Behavioral:  Negative for confusion.     Past Medical History:  Diagnosis Date   Anemia    Chronic hypertension    Depression    Pregnancy induced hypertension    Past Surgical History:  Procedure Laterality Date   TONSILLECTOMY     TUBAL LIGATION Bilateral 04/06/2015   Procedure: POST PARTUM  TUBAL LIGATION;  Surgeon: Nadara Mustard, MD;  Location: [facility] ORS;  Service: Gynecology;  Laterality: Bilateral;   XI ROBOTIC LAPAROSCOPIC ASSISTED APPENDECTOMY N/A 09/24/2020   Procedure: XI ROBOTIC LAPAROSCOPIC ASSISTED APPENDECTOMY;  Surgeon: Duanne Guess, MD;  Location: [facility] ORS;  Service: General;  Laterality: N/A;    Family History  Problem Relation Age of Onset   Skin cancer Mother    Heart disease Father    Kidney disease Paternal Uncle    Breast cancer Maternal Grandmother 68   Leukemia Maternal Grandfather    Breast cancer Paternal Grandmother 31   Cervical cancer Paternal Grandmother        dx 30s   Lung cancer Paternal Grandfather 31   Colon cancer Paternal Grandfather 42   Cancer Neg Hx     Social History   Socioeconomic History   Marital status: Significant Other    Spouse name: Not on file   Number of children: 2   Years of education: Not on file   Highest education level: Not on file  Occupational History   Occupation: Lobbyist: [employer]  Tobacco Use   Smoking status: Never   Smokeless tobacco: Never  Substance and Sexual Activity   Alcohol use: No   Drug use: No   Sexual activity: Yes    Birth control/protection: Surgical  Other Topics Concern   Not on file  Social History Narrative   Not on file   Social Determinants of Health   Financial Resource Strain: Not on file  Food Insecurity: Not on file  Transportation Needs: Not on file  Physical Activity: Not on file  Stress: Not on file  Social Connections: Not on file  Intimate Partner Violence: Not on file    Current Outpatient Medications on File Prior to Visit  Medication Sig Dispense Refill   WEGOVY 2.4 MG/0.75ML SOAJ Inject into the skin.     albuterol (VENTOLIN HFA) 108 (90 Base) MCG/ACT inhaler Inhale 2 puffs into the lungs every 6 (six) hours as needed for wheezing or shortness of breath. 8 g 0   benzonatate (TESSALON) 100 MG capsule Take 1 capsule (100 mg total) by mouth  2 (two) times daily as needed for cough. 20 capsule 0   fluconazole (DIFLUCAN) 150 MG tablet Take 1 tablet (150 mg total) by mouth daily. (Patient not taking: Reported on 05/10/2022) 1 tablet 0   iron polysaccharides (NIFEREX) 150 MG capsule Take 150 mg by mouth daily.     mupirocin cream (BACTROBAN) 2 % Apply 1 Application topically 3 (three) times daily. (Patient not taking: Reported on 05/10/2022) 30 g 0   Spacer/Aero-Holding Chambers DEVI 1 each by Does not apply route as needed. 1 each 0   No current facility-administered medications on file prior to visit.    Allergies  Allergen Reactions   Codeine Swelling       Observations/Objective: There were no vitals filed for this visit. There is no height or weight on file to calculate BMI.  Physical Exam Neurological:     Mental Status: She is alert.     Labs    Latest Ref Rng & Units 04/26/2022    2:15 PM 01/02/2022    1:19 PM 10/03/2021    2:48 PM  CBC  WBC 4.0 - 10.5 K/uL 12.5  10.6  13.3   Hemoglobin 12.0 - 15.0 g/dL 16.112.1  09.612.2  04.511.4   Hematocrit 36.0 - 46.0 % 37.9  38.7  36.4   Platelets 150 - 400 K/uL 392  370  453    Lab Results  Component Value Date   IRON 32 04/26/2022   TIBC 356 04/26/2022   FERRITIN 18 04/26/2022    ASSESSMENT & PLAN:   Iron deficiency anemia Iron panel is consistent with iron deficiency.  No anemia.   She has tolerated IV Venofer treatments well previously. Given the history of heavy menstrual bleeding, I recommend IV Venofer weekly x2   Leukocytosis previous work-up showed negative ESR, CRP, ANA, RF, negative BCR-ABL1 by FISH, JAK2 mutation negative, negative peripheral blood flow cytometry, Normal myeloma panel.  leukocytosis was felt to be reactive.  Continue observation.   Orders Placed This Encounter  Procedures   CBC with Differential (Cancer Center Only)    Standing Status:   Future    Standing Expiration Date:   05/10/2023   Iron and TIBC    Standing Status:   Future     Standing Expiration Date:   05/10/2023   Ferritin    Standing Status:   Future    Standing Expiration Date:   05/10/2023   Retic Panel    Standing Status:   Future    Standing Expiration Date:   05/10/2023   I discussed the assessment and treatment plan with the patient. The patient was provided an opportunity to ask questions and all were answered.  The patient was advised to call back or seek an in-person evaluation if the symptoms worsen or if the condition fails to improve as anticipated.  Follow-up in 6 months.  He prefers virtual visit.  Rickard PatienceZhou Joeziah Voit, MD 05/10/2022 4:42 PM

## 2022-05-10 NOTE — Progress Notes (Signed)
Pt contacted for Mychart visit. No new concerns voiced.  

## 2022-05-10 NOTE — Assessment & Plan Note (Signed)
Iron panel is consistent with iron deficiency.  No anemia.   She has tolerated IV Venofer treatments well previously. Given the history of heavy menstrual bleeding, I recommend IV Venofer weekly x2

## 2022-05-10 NOTE — Assessment & Plan Note (Signed)
previous work-up showed negative ESR, CRP, ANA, RF, negative BCR-ABL1 by FISH, JAK2 mutation negative, negative peripheral blood flow cytometry, Normal myeloma panel.  leukocytosis was felt to be reactive.  Continue observation.

## 2022-05-15 ENCOUNTER — Inpatient Hospital Stay: Payer: 59

## 2022-05-15 VITALS — BP 117/58 | HR 82 | Temp 98.0°F | Resp 18

## 2022-05-15 DIAGNOSIS — D509 Iron deficiency anemia, unspecified: Secondary | ICD-10-CM | POA: Diagnosis present

## 2022-05-15 MED ORDER — SODIUM CHLORIDE 0.9 % IV SOLN
200.0000 mg | Freq: Once | INTRAVENOUS | Status: AC
  Administered 2022-05-15: 200 mg via INTRAVENOUS
  Filled 2022-05-15: qty 200

## 2022-05-15 MED ORDER — SODIUM CHLORIDE 0.9 % IV SOLN
Freq: Once | INTRAVENOUS | Status: AC
  Filled 2022-05-15: qty 250

## 2022-05-15 NOTE — Patient Instructions (Signed)

## 2022-05-22 ENCOUNTER — Inpatient Hospital Stay: Payer: 59

## 2022-05-22 VITALS — BP 122/69 | HR 94 | Temp 98.6°F | Resp 18

## 2022-05-22 DIAGNOSIS — D509 Iron deficiency anemia, unspecified: Secondary | ICD-10-CM

## 2022-05-22 MED ORDER — SODIUM CHLORIDE 0.9 % IV SOLN
200.0000 mg | Freq: Once | INTRAVENOUS | Status: AC
  Administered 2022-05-22: 200 mg via INTRAVENOUS
  Filled 2022-05-22: qty 200

## 2022-05-22 MED ORDER — SODIUM CHLORIDE 0.9 % IV SOLN
Freq: Once | INTRAVENOUS | Status: AC
  Filled 2022-05-22: qty 250

## 2022-05-22 NOTE — Patient Instructions (Signed)
[facility]  Discharge Instructions: Thank you for choosing [facility] to provide your oncology and hematology care.  If you have a lab appointment with the Cancer Center, please go directly to the Cancer Center and check in at the registration area.  Wear comfortable clothing and clothing appropriate for easy access to any Portacath or PICC line.   We strive to give you quality time with your provider. You may need to reschedule your appointment if you arrive late (15 or more minutes).  Arriving late affects you and other patients whose appointments are after yours.  Also, if you miss three or more appointments without notifying the office, you may be dismissed from the clinic at the provider's discretion.      For prescription refill requests, have your pharmacy contact our office and allow 72 hours for refills to be completed.    Today you received the following chemotherapy and/or immunotherapy agents Venofer.      To help prevent nausea and vomiting after your treatment, we encourage you to take your nausea medication as directed.  BELOW ARE SYMPTOMS THAT SHOULD BE REPORTED IMMEDIATELY: *FEVER GREATER THAN 100.4 F (38 C) OR HIGHER *CHILLS OR SWEATING *NAUSEA AND VOMITING THAT IS NOT CONTROLLED WITH YOUR NAUSEA MEDICATION *UNUSUAL SHORTNESS OF BREATH *UNUSUAL BRUISING OR BLEEDING *URINARY PROBLEMS (pain or burning when urinating, or frequent urination) *BOWEL PROBLEMS (unusual diarrhea, constipation, pain near the anus) TENDERNESS IN MOUTH AND THROAT WITH OR WITHOUT PRESENCE OF ULCERS (sore throat, sores in mouth, or a toothache) UNUSUAL RASH, SWELLING OR PAIN  UNUSUAL VAGINAL DISCHARGE OR ITCHING   Items with * indicate a potential emergency and should be followed up as soon as possible or go to the Emergency Department if any problems should occur.  Please show the CHEMOTHERAPY ALERT CARD or IMMUNOTHERAPY ALERT CARD at check-in to  the Emergency Department and triage nurse.  Should you have questions after your visit or need to cancel or reschedule your appointment, please contact [facility] [place] [facility]  [phone] and follow the prompts.  Office hours are 8:00 a.m. to 4:30 p.m. Monday - Friday. Please note that voicemails left after 4:00 p.m. may not be returned until the following business day.  We are closed weekends and major holidays. You have access to a nurse at all times for urgent questions. Please call the main number to the clinic [phone] and follow the prompts.  For any non-urgent questions, you may also contact your provider using MyChart. We now offer e-Visits for anyone 18 and older to request care online for non-urgent symptoms. For details visit [email or web address].   Also download the MyChart app! Go to the app store, search "MyChart", open the app, select [place], and log in with your MyChart username and password.   

## 2022-05-29 ENCOUNTER — Telehealth: Payer: 59 | Admitting: Nurse Practitioner

## 2022-05-29 ENCOUNTER — Ambulatory Visit: Payer: 59 | Admitting: Dermatology

## 2022-05-29 DIAGNOSIS — L237 Allergic contact dermatitis due to plants, except food: Secondary | ICD-10-CM

## 2022-05-29 MED ORDER — PREDNISONE 10 MG PO TABS
ORAL_TABLET | ORAL | 0 refills | Status: DC
Start: 2022-05-29 — End: 2022-09-20

## 2022-05-29 NOTE — Progress Notes (Signed)
E-Visit for Poison Ivy  We are sorry that you are not feeing well.  Here is how we plan to help!  Based on what you have shared with me it looks like you have had an allergic reaction to the oily resin from a group of plants.  This resin is very sticky, so it easily attaches to your skin, clothing, tools equipment, and pet's fur.    This blistering rash is often called poison ivy rash although it can come from contact with the leaves, stems and roots of poison ivy, poison oak and poison sumac.  The oily resin contains urushiol (u-ROO-she-ol) that produces a skin rash on exposed skin.  The severity of the rash depends on the amount of urushiol that gets on your skin.  A section of skin with more urushiol on it may develop a rash sooner.  The rash usually develops 12-48 hours after exposure and can last two to three weeks.  Your skin must come in direct contact with the plant's oil to be affected.  Blister fluid doesn't spread the rash.  However, if you come into contact with a piece of clothing or pet fur that has urushiol on it, the rash may spread out.  You can also transfer the oil to other parts of your body with your fingers.  Often the rash looks like a straight line because of the way the plant brushes against your skin.  Since your rash is widespread or has resulted in a large number of blisters, I have prescribed an oral corticosteroid.  Please follow these recommendations:  I have sent a prednisone dose pack to your chosen pharmacy. Be sure to follow the instructions carefully and complete the entire prescription. You may use Benadryl or Caladryl topical lotions to sooth the itch and remember cool, not hot, showers and baths can help relieve the itching!  Place cool, wet compresses on the affected area for 15-30 minutes several times a day.  You may also take oral antihistamines, such as diphenhydramine (Benadryl, others), which may also help you sleep better.  Watch your skin for any purulent  (pus) drainage or red streaking from the site.  If this occurs, contact your provider.  You may require an antibiotic for a skin infection.  Make sure that the clothes you were wearing as well as any towels or sheets that may have come in contact with the oil (urushiol) are washed in detergent and hot water.       I have developed the following plan to treat your condition I am prescribing a two week course of steroids (37 tablets of 10 mg prednisone).  Days 1-4 take 4 tablets (40 mg) daily  Days 5-8 take 3 tablets (30 mg) daily, Days 9-11 take 2 tablets (20 mg) daily, Days 12-14 take 1 tablet (10 mg) daily.    What can you do to prevent this rash?  Avoid the plants.  Learn how to identify poison ivy, poison oak and poison sumac in all seasons.  When hiking or engaging in other activities that might expose you to these plants, try to stay on cleared pathways.  If camping, make sure you pitch your tent in an area free of these plants.  Keep pets from running through wooded areas so that urushiol doesn't accidentally stick to their fur, which you may touch.  Remove or kill the plants.  In your yard, you can get rid of poison ivy by applying an herbicide or pulling it out of   the ground, including the roots, while wearing heavy gloves.  Afterward remove the gloves and thoroughly wash them and your hands.  Don't burn poison ivy or related plants because the urushiol can be carried by smoke.  Wear protective clothing.  If needed, protect your skin by wearing socks, boots, pants, long sleeves and vinyl gloves.  Wash your skin right away.  Washing off the oil with soap and water within 30 minutes of exposure may reduce your chances of getting a poison ivy rash.  Even washing after an hour or so can help reduce the severity of the rash.  If you walk through some poison ivy and then later touch your shoes, you may get some urushiol on your hands, which may then transfer to your face or body by touching or  rubbing.  If the contaminated object isn't cleaned, the urushiol on it can still cause a skin reaction years later.    Be careful not to reuse towels after you have washed your skin.  Also carefully wash clothing in detergent and hot water to remove all traces of the oil.  Handle contaminated clothing carefully so you don't transfer the urushiol to yourself, furniture, rugs or appliances.  Remember that pets can carry the oil on their fur and paws.  If you think your pet may be contaminated with urushiol, put on some long rubber gloves and give your pet a bath.  Finally, be careful not to burn these plants as the smoke can contain traces of the oil.  Inhaling the smoke may result in difficulty breathing. If that occurred you should see a physician as soon as possible.  See your doctor right away if:  The reaction is severe or widespread You inhaled the smoke from burning poison ivy and are having difficulty breathing Your skin continues to swell The rash affects your eyes, mouth or genitals Blisters are oozing pus You develop a fever greater than 100 F (37.8 C) The rash doesn't get better within a few weeks.  If you scratch the poison ivy rash, bacteria under your fingernails may cause the skin to become infected.  See your doctor if pus starts oozing from the blisters.  Treatment generally includes antibiotics.  Poison ivy treatments are usually limited to self-care methods.  And the rash typically goes away on its own in two to three weeks.     If the rash is widespread or results in a large number of blisters, your doctor may prescribe an oral corticosteroid, such as prednisone.  If a bacterial infection has developed at the rash site, your doctor may give you a prescription for an oral antibiotic.  MAKE SURE YOU  Understand these instructions. Will watch your condition. Will get help right away if you are not doing well or get worse.   Thank you for choosing an e-visit.  Your  e-visit answers were reviewed by a board certified advanced clinical practitioner to complete your personal care plan. Depending upon the condition, your plan could have included both over the counter or prescription medications.  Please review your pharmacy choice. Make sure the pharmacy is open so you can pick up prescription now. If there is a problem, you may contact your provider through MyChart messaging and have the prescription routed to another pharmacy.  Your safety is important to us. If you have drug allergies check your prescription carefully.   For the next 24 hours you can use MyChart to ask questions about today's visit, request a non-urgent   call back, or ask for a work or school excuse. You will get an email in the next two days asking about your experience. I hope that your e-visit has been valuable and will speed your recovery.  Meds ordered this encounter  Medications   predniSONE (DELTASONE) 10 MG tablet    Sig: Take 4 tablets (40mg) on days 1-4, then 3 tablets (30mg) on days 5-8, then 2 tablets (20mg) on days 9-11, then 1 tablet daily for days 12-14. Take with food.    Dispense:  37 tablet    Refill:  0    I spent approximately 5 minutes reviewing the patient's history, current symptoms and coordinating their care today.   

## 2022-06-14 ENCOUNTER — Telehealth: Payer: 59 | Admitting: Family Medicine

## 2022-06-14 DIAGNOSIS — B3731 Acute candidiasis of vulva and vagina: Secondary | ICD-10-CM

## 2022-06-14 MED ORDER — FLUCONAZOLE 150 MG PO TABS: 150.0000 mg | ORAL_TABLET | Freq: Once | ORAL | 0 refills | Status: AC

## 2022-06-14 NOTE — Progress Notes (Signed)

## 2022-07-26 ENCOUNTER — Telehealth: Payer: 59 | Admitting: Physician Assistant

## 2022-07-26 DIAGNOSIS — L02419 Cutaneous abscess of limb, unspecified: Secondary | ICD-10-CM

## 2022-07-26 DIAGNOSIS — R21 Rash and other nonspecific skin eruption: Secondary | ICD-10-CM | POA: Diagnosis not present

## 2022-07-26 MED ORDER — NYSTATIN 100000 UNIT/GM EX CREA
1.0000 | TOPICAL_CREAM | Freq: Two times a day (BID) | CUTANEOUS | 0 refills | Status: DC
Start: 2022-07-26 — End: 2022-11-12

## 2022-07-26 MED ORDER — CEPHALEXIN 500 MG PO CAPS
500.0000 mg | ORAL_CAPSULE | Freq: Four times a day (QID) | ORAL | 0 refills | Status: AC
Start: 2022-07-26 — End: 2022-07-31

## 2022-07-26 NOTE — Progress Notes (Signed)
E Visit for Cellulitis  We are sorry that you are not feeling well. Here is how we plan to help!  Based on what you shared with me it looks like you have a boil/abscess of the axilla with an associated rash. The rash has a fungal appearance to it.   I have prescribed:  Keflex 500mg  take one by mouth four times a day for 5 days for the boil and Nystatin cream apply topically twice daily.  HOME CARE:  Take your medications as ordered and take all of them, even if the skin irritation appears to be healing.   GET HELP RIGHT AWAY IF:  Symptoms that don't begin to go away within 48 hours. Severe redness persists or worsens If the area turns color, spreads or swells. If it blisters and opens, develops yellow-brown crust or bleeds. You develop a fever or chills. If the pain increases or becomes unbearable.  Are unable to keep fluids and food down.  MAKE SURE YOU   Understand these instructions. Will watch your condition. Will get help right away if you are not doing well or get worse.  Thank you for choosing an e-visit.  Your e-visit answers were reviewed by a board certified advanced clinical practitioner to complete your personal care plan. Depending upon the condition, your plan could have included both over the counter or prescription medications.  Please review your pharmacy choice. Make sure the pharmacy is open so you can pick up prescription now. If there is a problem, you may contact your provider through [employer] and have the prescription routed to another pharmacy.  Your safety is important to [place]. If you have drug allergies check your prescription carefully.   For the next 24 hours you can use MyChart to ask questions about today's visit, request a non-urgent call back, or ask for a work or school excuse. You will get an email in the next two days asking about your experience. I hope that your e-visit has been valuable and will speed your recovery.  I have spent 5  minutes in review of e-visit questionnaire, review and updating patient chart, medical decision making and response to patient.   Margaretann Loveless, PA-C

## 2022-09-20 ENCOUNTER — Telehealth: Payer: 59 | Admitting: Nurse Practitioner

## 2022-09-20 DIAGNOSIS — L237 Allergic contact dermatitis due to plants, except food: Secondary | ICD-10-CM

## 2022-09-20 MED ORDER — PREDNISONE 10 MG PO TABS
ORAL_TABLET | ORAL | 0 refills | Status: DC
Start: 2022-09-20 — End: 2022-11-12

## 2022-09-20 NOTE — Progress Notes (Signed)
E-Visit for Poison Ivy  We are sorry that you are not feeing well.  Here is how we plan to help!  Based on what you have shared with me it looks like you have had an allergic reaction to the oily resin from a group of plants.  This resin is very sticky, so it easily attaches to your skin, clothing, tools equipment, and pet's fur.    This blistering rash is often called poison ivy rash although it can come from contact with the leaves, stems and roots of poison ivy, poison oak and poison sumac.  The oily resin contains urushiol (u-ROO-she-ol) that produces a skin rash on exposed skin.  The severity of the rash depends on the amount of urushiol that gets on your skin.  A section of skin with more urushiol on it may develop a rash sooner.  The rash usually develops 12-48 hours after exposure and can last two to three weeks.  Your skin must come in direct contact with the plant's oil to be affected.  Blister fluid doesn't spread the rash.  However, if you come into contact with a piece of clothing or pet fur that has urushiol on it, the rash may spread out.  You can also transfer the oil to other parts of your body with your fingers.  Often the rash looks like a straight line because of the way the plant brushes against your skin.  Since your rash is widespread or has resulted in a large number of blisters, I have prescribed an oral corticosteroid.  Please follow these recommendations:  I have sent a prednisone dose pack to your chosen pharmacy. Be sure to follow the instructions carefully and complete the entire prescription. You may use Benadryl or Caladryl topical lotions to sooth the itch and remember cool, not hot, showers and baths can help relieve the itching!  Place cool, wet compresses on the affected area for 15-30 minutes several times a day.  You may also take oral antihistamines, such as diphenhydramine (Benadryl, others), which may also help you sleep better.  Watch your skin for any purulent  (pus) drainage or red streaking from the site.  If this occurs, contact your provider.  You may require an antibiotic for a skin infection.  Make sure that the clothes you were wearing as well as any towels or sheets that may have come in contact with the oil (urushiol) are washed in detergent and hot water.       I have developed the following plan to treat your condition I am prescribing a two week course of steroids (37 tablets of 10 mg prednisone).  Days 1-4 take 4 tablets (40 mg) daily  Days 5-8 take 3 tablets (30 mg) daily, Days 9-11 take 2 tablets (20 mg) daily, Days 12-14 take 1 tablet (10 mg) daily.    What can you do to prevent this rash?  Avoid the plants.  Learn how to identify poison ivy, poison oak and poison sumac in all seasons.  When hiking or engaging in other activities that might expose you to these plants, try to stay on cleared pathways.  If camping, make sure you pitch your tent in an area free of these plants.  Keep pets from running through wooded areas so that urushiol doesn't accidentally stick to their fur, which you may touch.  Remove or kill the plants.  In your yard, you can get rid of poison ivy by applying an herbicide or pulling it out of   the ground, including the roots, while wearing heavy gloves.  Afterward remove the gloves and thoroughly wash them and your hands.  Don't burn poison ivy or related plants because the urushiol can be carried by smoke.  Wear protective clothing.  If needed, protect your skin by wearing socks, boots, pants, long sleeves and vinyl gloves.  Wash your skin right away.  Washing off the oil with soap and water within 30 minutes of exposure may reduce your chances of getting a poison ivy rash.  Even washing after an hour or so can help reduce the severity of the rash.  If you walk through some poison ivy and then later touch your shoes, you may get some urushiol on your hands, which may then transfer to your face or body by touching or  rubbing.  If the contaminated object isn't cleaned, the urushiol on it can still cause a skin reaction years later.    Be careful not to reuse towels after you have washed your skin.  Also carefully wash clothing in detergent and hot water to remove all traces of the oil.  Handle contaminated clothing carefully so you don't transfer the urushiol to yourself, furniture, rugs or appliances.  Remember that pets can carry the oil on their fur and paws.  If you think your pet may be contaminated with urushiol, put on some long rubber gloves and give your pet a bath.  Finally, be careful not to burn these plants as the smoke can contain traces of the oil.  Inhaling the smoke may result in difficulty breathing. If that occurred you should see a physician as soon as possible.  See your doctor right away if:  The reaction is severe or widespread You inhaled the smoke from burning poison ivy and are having difficulty breathing Your skin continues to swell The rash affects your eyes, mouth or genitals Blisters are oozing pus You develop a fever greater than 100 F (37.8 C) The rash doesn't get better within a few weeks.  If you scratch the poison ivy rash, bacteria under your fingernails may cause the skin to become infected.  See your doctor if pus starts oozing from the blisters.  Treatment generally includes antibiotics.  Poison ivy treatments are usually limited to self-care methods.  And the rash typically goes away on its own in two to three weeks.     If the rash is widespread or results in a large number of blisters, your doctor may prescribe an oral corticosteroid, such as prednisone.  If a bacterial infection has developed at the rash site, your doctor may give you a prescription for an oral antibiotic.  MAKE SURE YOU  Understand these instructions. Will watch your condition. Will get help right away if you are not doing well or get worse.   Thank you for choosing an e-visit.  Your  e-visit answers were reviewed by a board certified advanced clinical practitioner to complete your personal care plan. Depending upon the condition, your plan could have included both over the counter or prescription medications.  Please review your pharmacy choice. Make sure the pharmacy is open so you can pick up prescription now. If there is a problem, you may contact your provider through MyChart messaging and have the prescription routed to another pharmacy.  Your safety is important to us. If you have drug allergies check your prescription carefully.   For the next 24 hours you can use MyChart to ask questions about today's visit, request a non-urgent   call back, or ask for a work or school excuse. You will get an email in the next two days asking about your experience. I hope that your e-visit has been valuable and will speed your recovery.  Meds ordered this encounter  Medications   predniSONE (DELTASONE) 10 MG tablet    Sig: Take 4 tablets (40mg) on days 1-4, then 3 tablets (30mg) on days 5-8, then 2 tablets (20mg) on days 9-11, then 1 tablet daily for days 12-14. Take with food.    Dispense:  37 tablet    Refill:  0    I spent approximately 5 minutes reviewing the patient's history, current symptoms and coordinating their care today.   

## 2022-10-04 ENCOUNTER — Other Ambulatory Visit: Payer: 59

## 2022-10-04 ENCOUNTER — Ambulatory Visit: Payer: 59 | Admitting: Oncology

## 2022-11-08 ENCOUNTER — Inpatient Hospital Stay: Payer: 59 | Attending: Oncology

## 2022-11-08 DIAGNOSIS — D72829 Elevated white blood cell count, unspecified: Secondary | ICD-10-CM | POA: Insufficient documentation

## 2022-11-08 DIAGNOSIS — D509 Iron deficiency anemia, unspecified: Secondary | ICD-10-CM | POA: Diagnosis present

## 2022-11-08 LAB — IRON AND TIBC
Iron: 35 ug/dL (ref 28–170)
Saturation Ratios: 13 % (ref 10.4–31.8)
TIBC: 277 ug/dL (ref 250–450)
UIBC: 242 ug/dL

## 2022-11-08 LAB — CBC WITH DIFFERENTIAL (CANCER CENTER ONLY)
Abs Immature Granulocytes: 0.02 10*3/uL (ref 0.00–0.07)
Basophils Absolute: 0.1 10*3/uL (ref 0.0–0.1)
Basophils Relative: 1 %
Eosinophils Absolute: 0.2 10*3/uL (ref 0.0–0.5)
Eosinophils Relative: 2 %
HCT: 37.8 % (ref 36.0–46.0)
Hemoglobin: 11.9 g/dL — ABNORMAL LOW (ref 12.0–15.0)
Immature Granulocytes: 0 %
Lymphocytes Relative: 30 %
Lymphs Abs: 2.9 10*3/uL (ref 0.7–4.0)
MCH: 27.2 pg (ref 26.0–34.0)
MCHC: 31.5 g/dL (ref 30.0–36.0)
MCV: 86.3 fL (ref 80.0–100.0)
Monocytes Absolute: 0.5 10*3/uL (ref 0.1–1.0)
Monocytes Relative: 5 %
Neutro Abs: 6.2 10*3/uL (ref 1.7–7.7)
Neutrophils Relative %: 62 %
Platelet Count: 382 10*3/uL (ref 150–400)
RBC: 4.38 MIL/uL (ref 3.87–5.11)
RDW: 13.4 % (ref 11.5–15.5)
WBC Count: 9.9 10*3/uL (ref 4.0–10.5)
nRBC: 0 % (ref 0.0–0.2)

## 2022-11-08 LAB — RETIC PANEL
Immature Retic Fract: 8 % (ref 2.3–15.9)
RBC.: 4.38 MIL/uL (ref 3.87–5.11)
Retic Count, Absolute: 70.1 10*3/uL (ref 19.0–186.0)
Retic Ct Pct: 1.6 % (ref 0.4–3.1)
Reticulocyte Hemoglobin: 29.2 pg (ref >27.9)

## 2022-11-08 LAB — FERRITIN: Ferritin: 27 ng/mL (ref 11–307)

## 2022-11-12 ENCOUNTER — Encounter: Payer: Self-pay | Admitting: Oncology

## 2022-11-12 ENCOUNTER — Inpatient Hospital Stay: Payer: 59 | Admitting: Oncology

## 2022-11-12 DIAGNOSIS — D72829 Elevated white blood cell count, unspecified: Secondary | ICD-10-CM

## 2022-11-12 DIAGNOSIS — D509 Iron deficiency anemia, unspecified: Secondary | ICD-10-CM

## 2022-11-12 NOTE — Progress Notes (Signed)
HEMATOLOGY-ONCOLOGY TeleHEALTH VISIT PROGRESS NOTE  I connected with Cheryl Pope on 11/12/22  at  2:45 PM EDT by video enabled telemedicine visit and verified that I am speaking with the correct person using two identifiers. I discussed the limitations, risks, security and privacy concerns of performing an evaluation and management service by telemedicine and the availability of in-person appointments. The patient expressed understanding and agreed to proceed.   Other persons participating in the visit and their role in the encounter:  None  Patient's location: at work Provider's location: office Chief Complaint: History of leukocytosis and iron deficiency anemia   INTERVAL HISTORY Cheryl Pope is a 32 y.o. female who has above history reviewed by me today presents for follow up visit  Patient reports feeling well.  Some fatigue.  Menstrual period has been less heavy recently No other new complaints.  Review of Systems  Constitutional:  Positive for fatigue. Negative for appetite change, chills and fever.  HENT:   Negative for hearing loss and voice change.   Eyes:  Negative for eye problems.  Respiratory:  Negative for chest tightness and cough.   Cardiovascular:  Negative for chest pain.  Gastrointestinal:  Negative for abdominal distention, abdominal pain and blood in stool.  Endocrine: Negative for hot flashes.  Genitourinary:  Negative for difficulty urinating and frequency.   Musculoskeletal:  Negative for arthralgias.  Skin:  Negative for itching and rash.  Neurological:  Negative for extremity weakness.  Hematological:  Negative for adenopathy.  Psychiatric/Behavioral:  Negative for confusion.     Past Medical History:  Diagnosis Date   Anemia    Chronic hypertension    Depression    Pregnancy induced hypertension    Past Surgical History:  Procedure Laterality Date   TONSILLECTOMY     TUBAL LIGATION Bilateral 04/06/2015   Procedure: POST PARTUM TUBAL  LIGATION;  Surgeon: Nadara Mustard, MD;  Location: [facility];  Service: Gynecology;  Laterality: Bilateral;   XI ROBOTIC LAPAROSCOPIC ASSISTED APPENDECTOMY N/A 09/24/2020   Procedure: XI ROBOTIC LAPAROSCOPIC ASSISTED APPENDECTOMY;  Surgeon: Duanne Guess, MD;  Location: [facility];  Service: General;  Laterality: N/A;    Family History  Problem Relation Age of Onset   Skin cancer Mother    Heart disease Father    Kidney disease Paternal Uncle    Breast cancer Maternal Grandmother 78   Leukemia Maternal Grandfather    Breast cancer Paternal Grandmother 30   Cervical cancer Paternal Grandmother        dx 30s   Lung cancer Paternal Grandfather 26   Colon cancer Paternal Grandfather 25   Cancer Neg Hx     Social History   Socioeconomic History   Marital status: Significant Other    Spouse name: Not on file   Number of children: 2   Years of education: Not on file   Highest education level: Not on file  Occupational History   Occupation: Lobbyist: [employer]  Tobacco Use   Smoking status: Never   Smokeless tobacco: Never  Substance and Sexual Activity   Alcohol use: No   Drug use: No   Sexual activity: Yes    Birth control/protection: Surgical  Other Topics Concern   Not on file  Social History Narrative   Not on file   Social Determinants of Health   Financial Resource Strain: Not on file  Food Insecurity: Not on file  Transportation Needs: Not on file  Physical Activity: Not on file  Stress: Not on file  Social Connections: Not on file  Intimate Partner Violence: Not on file    Current Outpatient Medications on File Prior to Visit  Medication Sig Dispense Refill   WEGOVY 2.4 MG/0.75ML SOAJ Inject into the skin.     No current facility-administered medications on file prior to visit.    Allergies  Allergen Reactions   Codeine Swelling       Observations/Objective: There were no vitals filed for this visit. There is no height or weight on file to  calculate BMI.  Physical Exam Neurological:     Mental Status: She is alert.     Labs    Latest Ref Rng & Units 11/08/2022   10:42 AM 04/26/2022    2:15 PM 01/02/2022    1:19 PM  CBC  WBC 4.0 - 10.5 K/uL 9.9  12.5  10.6   Hemoglobin 12.0 - 15.0 g/dL 41.3  24.4  01.0   Hematocrit 36.0 - 46.0 % 37.8  37.9  38.7   Platelets 150 - 400 K/uL 382  392  370    Lab Results  Component Value Date   IRON 35 11/08/2022   TIBC 277 11/08/2022   FERRITIN 27 11/08/2022    ASSESSMENT & PLAN:   Iron deficiency anemia Iron panel is consistent with iron deficiency.  Lab Results  Component Value Date   HGB 11.9 (L) 11/08/2022   TIBC 277 11/08/2022   IRONPCTSAT 13 11/08/2022   FERRITIN 27 11/08/2022     She has tolerated IV Venofer treatments well previously. Recommend IV Venofer x 1 for maintenance.  Given the history of heavy menstrual bleeding, I recommend IV Venofer weekly x2   Leukocytosis previous work-up showed negative ESR, CRP, ANA, RF, negative BCR-ABL1 by FISH, JAK2 mutation negative, negative peripheral blood flow cytometry, Normal myeloma panel. leukocytosis was felt to be reactive.  Recent cbc showed resolved leukocytosis   Orders Placed This Encounter  Procedures   CBC with Differential (Cancer Center Only)    Standing Status:   Future    Standing Expiration Date:   11/12/2023   Iron and TIBC    Standing Status:   Future    Standing Expiration Date:   11/12/2023   Ferritin    Standing Status:   Future    Standing Expiration Date:   11/12/2023   Retic Panel    Standing Status:   Future    Standing Expiration Date:   11/12/2023   I discussed the assessment and treatment plan with the patient. The patient was provided an opportunity to ask questions and all were answered.  The patient was advised to call back or seek an in-person evaluation if the symptoms worsen or if the condition fails to improve as anticipated.  Follow-up in 6 months.    Rickard Patience, MD 11/12/2022  6:51 PM

## 2022-11-12 NOTE — Assessment & Plan Note (Addendum)
Iron panel is consistent with iron deficiency.  Lab Results  Component Value Date   HGB 11.9 (L) 11/08/2022   TIBC 277 11/08/2022   IRONPCTSAT 13 11/08/2022   FERRITIN 27 11/08/2022     She has tolerated IV Venofer treatments well previously. Recommend IV Venofer x 1 for maintenance.  Given the history of heavy menstrual bleeding, I recommend IV Venofer weekly x2

## 2022-11-12 NOTE — Assessment & Plan Note (Signed)
previous work-up showed negative ESR, CRP, ANA, RF, negative BCR-ABL1 by FISH, JAK2 mutation negative, negative peripheral blood flow cytometry, Normal myeloma panel. leukocytosis was felt to be reactive.  Recent cbc showed resolved leukocytosis

## 2022-11-13 ENCOUNTER — Inpatient Hospital Stay: Payer: 59

## 2022-12-05 ENCOUNTER — Telehealth: Payer: 59 | Admitting: Emergency Medicine

## 2022-12-05 DIAGNOSIS — B3731 Acute candidiasis of vulva and vagina: Secondary | ICD-10-CM

## 2022-12-05 MED ORDER — FLUCONAZOLE 150 MG PO TABS: 150.0000 mg | ORAL_TABLET | Freq: Once | ORAL | 0 refills | Status: AC

## 2022-12-05 NOTE — Progress Notes (Signed)

## 2023-01-22 ENCOUNTER — Telehealth: Payer: 59 | Admitting: Physician Assistant

## 2023-01-22 DIAGNOSIS — L0292 Furuncle, unspecified: Secondary | ICD-10-CM | POA: Diagnosis not present

## 2023-01-22 MED ORDER — SULFAMETHOXAZOLE-TRIMETHOPRIM 800-160 MG PO TABS
1.0000 | ORAL_TABLET | Freq: Two times a day (BID) | ORAL | 0 refills | Status: DC
Start: 2023-01-22 — End: 2023-05-28

## 2023-01-22 NOTE — Progress Notes (Signed)
E-Visit for Cellulitis  We are sorry that you are not feeling well. Here is how we plan to help!  Based on what you shared with me it looks like you have a small developing boil which can progress to cellulitis of surrounding skin.  Cellulitis looks like areas of skin redness, swelling, and warmth; it develops as a result of bacteria entering under the skin. Little red spots and/or bleeding can be seen in skin, and tiny surface sacs containing fluid can occur. Fever can be present. Cellulitis is almost always on one side of a body, and the lower limbs are the most common site of involvement.   I have prescribed:  Bactrim DS 1 tablet by mouth twice a day for 7 days  HOME CARE:  Take your medications as ordered and take all of them, even if the skin irritation appears to be healing.   GET HELP RIGHT AWAY IF:  Symptoms that don't begin to go away within 48 hours. Severe redness persists or worsens If the area turns color, spreads or swells. If it blisters and opens, develops yellow-brown crust or bleeds. You develop a fever or chills. If the pain increases or becomes unbearable.  Are unable to keep fluids and food down.  MAKE SURE YOU   Understand these instructions. Will watch your condition. Will get help right away if you are not doing well or get worse.  Thank you for choosing an e-visit.  Your e-visit answers were reviewed by a board certified advanced clinical practitioner to complete your personal care plan. Depending upon the condition, your plan could have included both over the counter or prescription medications.  Please review your pharmacy choice. Make sure the pharmacy is open so you can pick up prescription now. If there is a problem, you may contact your provider through [employer] and have the prescription routed to another pharmacy.  Your safety is important to [place]. If you have drug allergies check your prescription carefully.   For the next 24 hours you can use  MyChart to ask questions about today's visit, request a non-urgent call back, or ask for a work or school excuse. You will get an email in the next two days asking about your experience. I hope that your e-visit has been valuable and will speed your recovery.

## 2023-01-22 NOTE — Progress Notes (Signed)
I have spent 5 minutes in review of e-visit questionnaire, review and updating patient chart, medical decision making and response to patient.   Mia Milan Cody Jacklynn Dehaas, PA-C    

## 2023-04-19 ENCOUNTER — Telehealth: Admitting: Nurse Practitioner

## 2023-04-19 DIAGNOSIS — K047 Periapical abscess without sinus: Secondary | ICD-10-CM | POA: Diagnosis not present

## 2023-04-19 MED ORDER — IBUPROFEN 600 MG PO TABS
600.0000 mg | ORAL_TABLET | Freq: Three times a day (TID) | ORAL | 0 refills | Status: DC | PRN
Start: 2023-04-19 — End: 2023-08-22

## 2023-04-19 MED ORDER — CLINDAMYCIN HCL 300 MG PO CAPS
300.0000 mg | ORAL_CAPSULE | Freq: Three times a day (TID) | ORAL | 0 refills | Status: AC
Start: 2023-04-19 — End: 2023-04-26

## 2023-04-19 NOTE — Progress Notes (Signed)

## 2023-04-19 NOTE — Progress Notes (Signed)
 I have spent 5 minutes in review of e-visit questionnaire, review and updating patient chart, medical decision making and response to patient.   Claiborne Rigg, NP

## 2023-05-16 ENCOUNTER — Inpatient Hospital Stay: Payer: 59 | Attending: Oncology

## 2023-05-16 DIAGNOSIS — Z8049 Family history of malignant neoplasm of other genital organs: Secondary | ICD-10-CM | POA: Insufficient documentation

## 2023-05-16 DIAGNOSIS — R5383 Other fatigue: Secondary | ICD-10-CM | POA: Insufficient documentation

## 2023-05-16 DIAGNOSIS — D509 Iron deficiency anemia, unspecified: Secondary | ICD-10-CM | POA: Insufficient documentation

## 2023-05-16 DIAGNOSIS — Z803 Family history of malignant neoplasm of breast: Secondary | ICD-10-CM | POA: Insufficient documentation

## 2023-05-16 DIAGNOSIS — Z840 Family history of diseases of the skin and subcutaneous tissue: Secondary | ICD-10-CM | POA: Insufficient documentation

## 2023-05-16 DIAGNOSIS — Z801 Family history of malignant neoplasm of trachea, bronchus and lung: Secondary | ICD-10-CM | POA: Insufficient documentation

## 2023-05-16 DIAGNOSIS — D72829 Elevated white blood cell count, unspecified: Secondary | ICD-10-CM | POA: Insufficient documentation

## 2023-05-16 DIAGNOSIS — Z800 Family history of malignant neoplasm of digestive organs: Secondary | ICD-10-CM | POA: Insufficient documentation

## 2023-05-16 DIAGNOSIS — Z806 Family history of leukemia: Secondary | ICD-10-CM | POA: Insufficient documentation

## 2023-05-22 ENCOUNTER — Inpatient Hospital Stay: Payer: 59 | Admitting: Oncology

## 2023-05-22 ENCOUNTER — Other Ambulatory Visit

## 2023-05-23 ENCOUNTER — Inpatient Hospital Stay

## 2023-05-23 DIAGNOSIS — D509 Iron deficiency anemia, unspecified: Secondary | ICD-10-CM | POA: Diagnosis present

## 2023-05-23 DIAGNOSIS — Z806 Family history of leukemia: Secondary | ICD-10-CM | POA: Diagnosis not present

## 2023-05-23 DIAGNOSIS — R5383 Other fatigue: Secondary | ICD-10-CM | POA: Diagnosis not present

## 2023-05-23 DIAGNOSIS — Z803 Family history of malignant neoplasm of breast: Secondary | ICD-10-CM | POA: Diagnosis not present

## 2023-05-23 DIAGNOSIS — Z840 Family history of diseases of the skin and subcutaneous tissue: Secondary | ICD-10-CM | POA: Diagnosis not present

## 2023-05-23 DIAGNOSIS — Z800 Family history of malignant neoplasm of digestive organs: Secondary | ICD-10-CM | POA: Diagnosis not present

## 2023-05-23 DIAGNOSIS — Z801 Family history of malignant neoplasm of trachea, bronchus and lung: Secondary | ICD-10-CM | POA: Diagnosis not present

## 2023-05-23 DIAGNOSIS — Z8049 Family history of malignant neoplasm of other genital organs: Secondary | ICD-10-CM | POA: Diagnosis not present

## 2023-05-23 DIAGNOSIS — D72829 Elevated white blood cell count, unspecified: Secondary | ICD-10-CM | POA: Diagnosis not present

## 2023-05-23 LAB — CBC WITH DIFFERENTIAL (CANCER CENTER ONLY)
Abs Immature Granulocytes: 0.05 10*3/uL (ref 0.00–0.07)
Basophils Absolute: 0.1 10*3/uL (ref 0.0–0.1)
Basophils Relative: 1 %
Eosinophils Absolute: 0.3 10*3/uL (ref 0.0–0.5)
Eosinophils Relative: 2 %
HCT: 37.7 % (ref 36.0–46.0)
Hemoglobin: 11.6 g/dL — ABNORMAL LOW (ref 12.0–15.0)
Immature Granulocytes: 0 %
Lymphocytes Relative: 26 %
Lymphs Abs: 3.2 10*3/uL (ref 0.7–4.0)
MCH: 26.1 pg (ref 26.0–34.0)
MCHC: 30.8 g/dL (ref 30.0–36.0)
MCV: 84.9 fL (ref 80.0–100.0)
Monocytes Absolute: 0.6 10*3/uL (ref 0.1–1.0)
Monocytes Relative: 5 %
Neutro Abs: 8 10*3/uL — ABNORMAL HIGH (ref 1.7–7.7)
Neutrophils Relative %: 66 %
Platelet Count: 426 10*3/uL — ABNORMAL HIGH (ref 150–400)
RBC: 4.44 MIL/uL (ref 3.87–5.11)
RDW: 14.3 % (ref 11.5–15.5)
WBC Count: 12.2 10*3/uL — ABNORMAL HIGH (ref 4.0–10.5)
nRBC: 0 % (ref 0.0–0.2)

## 2023-05-23 LAB — RETIC PANEL
Immature Retic Fract: 9 % (ref 2.3–15.9)
RBC.: 4.35 MIL/uL (ref 3.87–5.11)
Retic Count, Absolute: 75.3 10*3/uL (ref 19.0–186.0)
Retic Ct Pct: 1.7 % (ref 0.4–3.1)
Reticulocyte Hemoglobin: 26.4 pg — ABNORMAL LOW (ref >27.9)

## 2023-05-23 LAB — IRON AND TIBC
Iron: 37 ug/dL (ref 28–170)
Saturation Ratios: 11 % (ref 10.4–31.8)
TIBC: 353 ug/dL (ref 250–450)
UIBC: 316 ug/dL

## 2023-05-23 LAB — FERRITIN: Ferritin: 10 ng/mL — ABNORMAL LOW (ref 11–307)

## 2023-05-28 ENCOUNTER — Encounter: Payer: Self-pay | Admitting: Oncology

## 2023-05-28 ENCOUNTER — Inpatient Hospital Stay (HOSPITAL_BASED_OUTPATIENT_CLINIC_OR_DEPARTMENT_OTHER): Admitting: Oncology

## 2023-05-28 DIAGNOSIS — D509 Iron deficiency anemia, unspecified: Secondary | ICD-10-CM | POA: Diagnosis not present

## 2023-05-28 NOTE — Assessment & Plan Note (Addendum)
 Iron  panel is consistent with iron  deficiency.  Lab Results  Component Value Date   HGB 11.6 (L) 05/23/2023   TIBC 353 05/23/2023   IRONPCTSAT 11 05/23/2023   FERRITIN 10 (L) 05/23/2023     She has tolerated IV Venofer  treatments well previously. Recommend IV Venofer  200mg  weekly x 4

## 2023-05-28 NOTE — Progress Notes (Signed)
 Pt contacted for myhchart. Pt reports that she has been bruising more lately

## 2023-05-28 NOTE — Progress Notes (Signed)
 HEMATOLOGY-ONCOLOGY TeleHEALTH VISIT PROGRESS NOTE  I connected with Cheryl Pope on 05/28/23  at  2:45 PM EDT by video enabled telemedicine visit and verified that I am speaking with the correct person using two identifiers. I discussed the limitations, risks, security and privacy concerns of performing an evaluation and management service by telemedicine and the availability of in-person appointments. The patient expressed understanding and agreed to proceed.   Other persons participating in the visit and their role in the encounter:  None  Patient's location: at work Provider's location: office Chief Complaint: History of leukocytosis and iron  deficiency anemia   INTERVAL HISTORY Cheryl Pope is a 33 y.o. female who has above history reviewed by me today presents for follow up visit  Patient reports feeling well.  Some fatigue.  Menstrual period has been less heavy recently. Denies any bowel habit changes, blood in stool No other new complaints.  Review of Systems  Constitutional:  Positive for fatigue. Negative for appetite change, chills and fever.  HENT:   Negative for hearing loss and voice change.   Eyes:  Negative for eye problems.  Respiratory:  Negative for chest tightness and cough.   Cardiovascular:  Negative for chest pain.  Gastrointestinal:  Negative for abdominal distention, abdominal pain and blood in stool.  Endocrine: Negative for hot flashes.  Genitourinary:  Negative for difficulty urinating and frequency.   Musculoskeletal:  Negative for arthralgias.  Skin:  Negative for itching and rash.  Neurological:  Negative for extremity weakness.  Hematological:  Negative for adenopathy.  Psychiatric/Behavioral:  Negative for confusion.     Past Medical History:  Diagnosis Date   Anemia    Chronic hypertension    Depression    Pregnancy induced hypertension    Past Surgical History:  Procedure Laterality Date   TONSILLECTOMY     TUBAL LIGATION  Bilateral 04/06/2015   Procedure: POST PARTUM TUBAL LIGATION;  Surgeon: Alben Alma, MD;  Location: [facility];  Service: Gynecology;  Laterality: Bilateral;   XI ROBOTIC LAPAROSCOPIC ASSISTED APPENDECTOMY N/A 09/24/2020   Procedure: XI ROBOTIC LAPAROSCOPIC ASSISTED APPENDECTOMY;  Surgeon: Mercy Stall, MD;  Location: [facility];  Service: General;  Laterality: N/A;    Family History  Problem Relation Age of Onset   Skin cancer Mother    Heart disease Father    Kidney disease Paternal Uncle    Breast cancer Maternal Grandmother 42   Leukemia Maternal Grandfather    Breast cancer Paternal Grandmother 48   Cervical cancer Paternal Grandmother        dx 30s   Lung cancer Paternal Grandfather 57   Colon cancer Paternal Grandfather 13   Cancer Neg Hx     Social History   Socioeconomic History   Marital status: Significant Other    Spouse name: Not on file   Number of children: 2   Years of education: Not on file   Highest education level: Not on file  Occupational History   Occupation: Lobbyist: [employer]  Tobacco Use   Smoking status: Never   Smokeless tobacco: Never  Substance and Sexual Activity   Alcohol use: No   Drug use: No   Sexual activity: Yes    Birth control/protection: Surgical  Other Topics Concern   Not on file  Social History Narrative   Not on file   Social Drivers of Health   Financial Resource Strain: Not on file  Food Insecurity: Not on file  Transportation Needs: Not on  file  Physical Activity: Not on file  Stress: Not on file  Social Connections: Not on file  Intimate Partner Violence: Not on file    Current Outpatient Medications on File Prior to Visit  Medication Sig Dispense Refill   ibuprofen  (ADVIL ) 600 MG tablet Take 1 tablet (600 mg total) by mouth every 8 (eight) hours as needed. 30 tablet 0   No current facility-administered medications on file prior to visit.    Allergies  Allergen Reactions   Codeine Swelling        Observations/Objective: There were no vitals filed for this visit. There is no height or weight on file to calculate BMI.  Physical Exam Neurological:     Mental Status: She is alert.     Labs    Latest Ref Rng & Units 05/23/2023    1:35 PM 11/08/2022   10:42 AM 04/26/2022    2:15 PM  CBC  WBC 4.0 - 10.5 K/uL 12.2  9.9  12.5   Hemoglobin 12.0 - 15.0 g/dL 40.9  81.1  91.4   Hematocrit 36.0 - 46.0 % 37.7  37.8  37.9   Platelets 150 - 400 K/uL 426  382  392    Lab Results  Component Value Date   IRON  37 05/23/2023   TIBC 353 05/23/2023   FERRITIN 10 (L) 05/23/2023    ASSESSMENT & PLAN:   Iron  deficiency anemia Iron  panel is consistent with iron  deficiency.  Lab Results  Component Value Date   HGB 11.6 (L) 05/23/2023   TIBC 353 05/23/2023   IRONPCTSAT 11 05/23/2023   FERRITIN 10 (L) 05/23/2023     She has tolerated IV Venofer  treatments well previously. Recommend IV Venofer  200mg  weekly x 4      Orders Placed This Encounter  Procedures   CBC with Differential (Cancer Center Only)    Standing Status:   Future    Expected Date:   11/27/2023    Expiration Date:   05/27/2024   Iron  and TIBC    Standing Status:   Future    Expected Date:   11/27/2023    Expiration Date:   05/27/2024   Ferritin    Standing Status:   Future    Expected Date:   11/27/2023    Expiration Date:   05/27/2024   Retic Panel    Standing Status:   Future    Expected Date:   11/27/2023    Expiration Date:   05/27/2024   Hepatic function panel    Standing Status:   Future    Expected Date:   11/27/2023    Expiration Date:   05/27/2024   I discussed the assessment and treatment plan with the patient. The patient was provided an opportunity to ask questions and all were answered.  The patient was advised to call back or seek an in-person evaluation if the symptoms worsen or if the condition fails to improve as anticipated.  Follow-up in 6 months.    Timmy Forbes, MD 05/28/2023 8:27 PM

## 2023-06-04 ENCOUNTER — Inpatient Hospital Stay

## 2023-06-04 VITALS — BP 137/54 | HR 99 | Temp 98.8°F | Resp 18

## 2023-06-04 DIAGNOSIS — D509 Iron deficiency anemia, unspecified: Secondary | ICD-10-CM

## 2023-06-04 MED ORDER — IRON SUCROSE 20 MG/ML IV SOLN
200.0000 mg | Freq: Once | INTRAVENOUS | Status: AC
  Administered 2023-06-04: 200 mg via INTRAVENOUS

## 2023-06-04 MED ORDER — SODIUM CHLORIDE 0.9% FLUSH
10.0000 mL | Freq: Once | INTRAVENOUS | Status: AC | PRN
  Administered 2023-06-04: 10 mL
  Filled 2023-06-04: qty 10

## 2023-06-11 ENCOUNTER — Inpatient Hospital Stay: Attending: Oncology

## 2023-06-11 VITALS — BP 119/71 | HR 82 | Temp 98.4°F | Resp 16

## 2023-06-11 DIAGNOSIS — R5383 Other fatigue: Secondary | ICD-10-CM | POA: Diagnosis not present

## 2023-06-11 DIAGNOSIS — Z801 Family history of malignant neoplasm of trachea, bronchus and lung: Secondary | ICD-10-CM | POA: Diagnosis not present

## 2023-06-11 DIAGNOSIS — D72829 Elevated white blood cell count, unspecified: Secondary | ICD-10-CM | POA: Diagnosis not present

## 2023-06-11 DIAGNOSIS — Z806 Family history of leukemia: Secondary | ICD-10-CM | POA: Insufficient documentation

## 2023-06-11 DIAGNOSIS — Z8049 Family history of malignant neoplasm of other genital organs: Secondary | ICD-10-CM | POA: Insufficient documentation

## 2023-06-11 DIAGNOSIS — Z840 Family history of diseases of the skin and subcutaneous tissue: Secondary | ICD-10-CM | POA: Insufficient documentation

## 2023-06-11 DIAGNOSIS — D509 Iron deficiency anemia, unspecified: Secondary | ICD-10-CM | POA: Diagnosis present

## 2023-06-11 DIAGNOSIS — Z803 Family history of malignant neoplasm of breast: Secondary | ICD-10-CM | POA: Insufficient documentation

## 2023-06-11 DIAGNOSIS — Z800 Family history of malignant neoplasm of digestive organs: Secondary | ICD-10-CM | POA: Insufficient documentation

## 2023-06-11 MED ORDER — IRON SUCROSE 20 MG/ML IV SOLN
200.0000 mg | Freq: Once | INTRAVENOUS | Status: AC
  Administered 2023-06-11: 200 mg via INTRAVENOUS

## 2023-06-11 MED ORDER — SODIUM CHLORIDE 0.9% FLUSH
10.0000 mL | Freq: Once | INTRAVENOUS | Status: DC | PRN
Start: 2023-06-11 — End: 2023-06-11
  Filled 2023-06-11: qty 10

## 2023-06-11 NOTE — Progress Notes (Signed)
 Declined post-observation. Aware of risks. Vitals stable at discharge.

## 2023-06-11 NOTE — Patient Instructions (Signed)

## 2023-06-18 ENCOUNTER — Inpatient Hospital Stay

## 2023-06-18 VITALS — BP 125/83 | HR 90 | Temp 96.7°F | Resp 17

## 2023-06-18 DIAGNOSIS — D509 Iron deficiency anemia, unspecified: Secondary | ICD-10-CM

## 2023-06-18 MED ORDER — SODIUM CHLORIDE 0.9% FLUSH
10.0000 mL | Freq: Once | INTRAVENOUS | Status: AC | PRN
  Administered 2023-06-18: 10 mL
  Filled 2023-06-18: qty 10

## 2023-06-18 MED ORDER — IRON SUCROSE 20 MG/ML IV SOLN
200.0000 mg | Freq: Once | INTRAVENOUS | Status: AC
  Administered 2023-06-18: 200 mg via INTRAVENOUS

## 2023-06-25 ENCOUNTER — Inpatient Hospital Stay

## 2023-07-07 ENCOUNTER — Telehealth: Admitting: Physician Assistant

## 2023-07-07 DIAGNOSIS — J069 Acute upper respiratory infection, unspecified: Secondary | ICD-10-CM | POA: Diagnosis not present

## 2023-07-07 MED ORDER — PSEUDOEPH-BROMPHEN-DM 30-2-10 MG/5ML PO SYRP: 5.0000 mL | ORAL_SOLUTION | Freq: Four times a day (QID) | ORAL | 0 refills | Status: DC | PRN

## 2023-07-07 MED ORDER — FLUTICASONE PROPIONATE 50 MCG/ACT NA SUSP
2.0000 | Freq: Every day | NASAL | 0 refills | Status: DC
Start: 2023-07-07 — End: 2023-07-08

## 2023-07-07 NOTE — Progress Notes (Signed)
 E-Visit for Upper Respiratory Infection   We are sorry you are not feeling well.  Here is how we plan to help!  Based on what you have shared with me, it looks like you may have a viral upper respiratory infection.  Upper respiratory infections are caused by a large number of viruses; however, rhinovirus is the most common cause.   Symptoms vary from person to person, with common symptoms including sore throat, cough, fatigue or lack of energy and feeling of general discomfort.  A low-grade fever of up to 100.4 may present, but is often uncommon.  Symptoms vary however, and are closely related to a person's age or underlying illnesses.  The most common symptoms associated with an upper respiratory infection are nasal discharge or congestion, cough, sneezing, headache and pressure in the ears and face.  These symptoms usually persist for about 3 to 10 days, but can last up to 2 weeks.  It is important to know that upper respiratory infections do not cause serious illness or complications in most cases.    Upper respiratory infections can be transmitted from person to person, with the most common method of transmission being a person's hands.  The virus is able to live on the skin and can infect other persons for up to 2 hours after direct contact.  Also, these can be transmitted when someone coughs or sneezes; thus, it is important to cover the mouth to reduce this risk.  To keep the spread of the illness at bay, good hand hygiene is very important.  This is an infection that is most likely caused by a virus. There are no specific treatments other than to help you with the symptoms until the infection runs its course.  We are sorry you are not feeling well.  Here is how we plan to help!   For nasal congestion, you may use an oral decongestants such as Mucinex D or if you have glaucoma or high blood pressure use plain Mucinex.  Saline nasal spray or nasal drops can help and can safely be used as often as  needed for congestion.  For your congestion, I have prescribed Fluticasone nasal spray one spray in each nostril twice a day  If you do not have a history of heart disease, hypertension, diabetes or thyroid disease, prostate/bladder issues or glaucoma, you may also use Sudafed to treat nasal congestion.  It is highly recommended that you consult with a pharmacist or your primary care physician to ensure this medication is safe for you to take.     For cough I have prescribed for you Bromfed Dm cough syrup Take 5mL every 6 hours as needed for cough.  If you have a sore or scratchy throat, use a saltwater gargle-  to  teaspoon of salt dissolved in a 4-ounce to 8-ounce glass of warm water.  Gargle the solution for approximately 15-30 seconds and then spit.  It is important not to swallow the solution.  You can also use throat lozenges/cough drops and Chloraseptic spray to help with throat pain or discomfort.  Warm or cold liquids can also be helpful in relieving throat pain.  For headache, pain or general discomfort, you can use Ibuprofen or Tylenol as directed.   Some authorities believe that zinc sprays or the use of Echinacea may shorten the course of your symptoms.   HOME CARE Only take medications as instructed by your medical team. Be sure to drink plenty of fluids. Water is fine as well  as fruit juices, sodas and electrolyte beverages. You may want to stay away from caffeine or alcohol. If you are nauseated, try taking small sips of liquids. How do you know if you are getting enough fluid? Your urine should be a pale yellow or almost colorless. Get rest. Taking a steamy shower or using a humidifier may help nasal congestion and ease sore throat pain. You can place a towel over your head and breathe in the steam from hot water coming from a faucet. Using a saline nasal spray works much the same way. Cough drops, hard candies and sore throat lozenges may ease your cough. Avoid close contacts  especially the very young and the elderly Cover your mouth if you cough or sneeze Always remember to wash your hands.   GET HELP RIGHT AWAY IF: You develop worsening fever. If your symptoms do not improve within 10 days You develop yellow or green discharge from your nose over 3 days. You have coughing fits You develop a severe head ache or visual changes. You develop shortness of breath, difficulty breathing or start having chest pain Your symptoms persist after you have completed your treatment plan  MAKE SURE YOU  Understand these instructions. Will watch your condition. Will get help right away if you are not doing well or get worse.  Thank you for choosing an e-visit.  Your e-visit answers were reviewed by a board certified advanced clinical practitioner to complete your personal care plan. Depending upon the condition, your plan could have included both over the counter or prescription medications.  Please review your pharmacy choice. Make sure the pharmacy is open so you can pick up prescription now. If there is a problem, you may contact your provider through [employer] and have the prescription routed to another pharmacy.  Your safety is important to [place]. If you have drug allergies check your prescription carefully.   For the next 24 hours you can use MyChart to ask questions about today's visit, request a non-urgent call back, or ask for a work or school excuse. You will get an email in the next two days asking about your experience. I hope that your e-visit has been valuable and will speed your recovery.        I have spent 5 minutes in review of e-visit questionnaire, review and updating patient chart, medical decision making and response to patient.   Margaretann Loveless, PA-C

## 2023-07-08 MED ORDER — BENZONATATE 100 MG PO CAPS: 100.0000 mg | ORAL_CAPSULE | Freq: Three times a day (TID) | ORAL | 0 refills | Status: DC | PRN

## 2023-07-08 MED ORDER — MOMETASONE FUROATE 50 MCG/ACT NA SUSP: 2.0000 | Freq: Every day | NASAL | 0 refills | Status: DC

## 2023-07-08 NOTE — Addendum Note (Signed)
 Addended by: Angelia Kelp on: 07/08/2023 11:51 AM   Modules accepted: Orders

## 2023-08-22 ENCOUNTER — Telehealth: Admitting: Physician Assistant

## 2023-08-22 DIAGNOSIS — J069 Acute upper respiratory infection, unspecified: Secondary | ICD-10-CM | POA: Diagnosis not present

## 2023-08-22 MED ORDER — PSEUDOEPH-BROMPHEN-DM 30-2-10 MG/5ML PO SYRP: 5.0000 mL | ORAL_SOLUTION | Freq: Four times a day (QID) | ORAL | 0 refills | Status: DC | PRN

## 2023-08-22 NOTE — Progress Notes (Signed)

## 2023-10-21 ENCOUNTER — Telehealth: Admitting: Physician Assistant

## 2023-10-21 DIAGNOSIS — K047 Periapical abscess without sinus: Secondary | ICD-10-CM

## 2023-10-21 MED ORDER — AMOXICILLIN-POT CLAVULANATE 875-125 MG PO TABS: 1.0000 | ORAL_TABLET | Freq: Two times a day (BID) | ORAL | 0 refills | Status: DC

## 2023-10-21 NOTE — Progress Notes (Signed)
 E-Visit for Dental Pain  We are sorry that you are not feeling well.  Here is how we plan to help!  Based on what you have shared with me in the questionnaire, it sounds like you have a dental abscess/infection.  I have prescribed Augmentin  875-125mg  twice a day for 7 days  It is imperative that you see a dentist within 10 days of this eVisit to determine the cause of the dental pain and be sure it is adequately treated  A toothache or tooth pain is caused when the nerve in the root of a tooth or surrounding a tooth is irritated. Dental (tooth) infection, decay, injury, or loss of a tooth are the most common causes of dental pain. Pain may also occur after an extraction (tooth is pulled out). Pain sometimes originates from other areas and radiates to the jaw, thus appearing to be tooth pain.Bacteria growing inside your mouth can contribute to gum disease and dental decay, both of which can cause pain. A toothache occurs from inflammation of the central portion of the tooth called pulp. The pulp contains nerve endings that are very sensitive to pain. Inflammation to the pulp or pulpitis may be caused by dental cavities, trauma, and infection.    HOME CARE:   For toothaches: Over-the-counter pain medications such as acetaminophen  or ibuprofen  may be used. Take these as directed on the package while you arrange for a dental appointment. Avoid very cold or hot foods, because they may make the pain worse. You may get relief from biting on a cotton ball soaked in oil of cloves. You can get oil of cloves at most drug stores.  For jaw pain:  Aspirin may be helpful for problems in the joint of the jaw in adults. If pain happens every time you open your mouth widely, the temporomandibular joint (TMJ) may be the source of the pain. Yawning or taking a large bite of food may worsen the pain. An appointment with your doctor or dentist will help you find the cause.     GET HELP RIGHT AWAY IF:  You have  a high fever or chills If you have had a recent head or face injury and develop headache, light headedness, nausea, vomiting, or other symptoms that concern you after an injury to your face or mouth, you could have a more serious injury in addition to your dental injury. A facial rash associated with a toothache: This condition may improve with medication. Contact your doctor for them to decide what is appropriate. Any jaw pain occurring with chest pain: Although jaw pain is most commonly caused by dental disease, it is sometimes referred pain from other areas. People with heart disease, especially people who have had stents placed, people with diabetes, or those who have had heart surgery may have jaw pain as a symptom of heart attack or angina. If your jaw or tooth pain is associated with lightheadedness, sweating, or shortness of breath, you should see a doctor as soon as possible. Trouble swallowing or excessive pain or bleeding from gums: If you have a history of a weakened immune system, diabetes, or steroid use, you may be more susceptible to infections. Infections can often be more severe and extensive or caused by unusual organisms. Dental and gum infections in people with these conditions may require more aggressive treatment. An abscess may need draining or IV antibiotics, for example.  MAKE SURE YOU   Understand these instructions. Will watch your condition. Will get help right away  if you are not doing well or get worse.  Thank you for choosing an e-visit.  Your e-visit answers were reviewed by a board certified advanced clinical practitioner to complete your personal care plan. Depending upon the condition, your plan could have included both over the counter or prescription medications.  Please review your pharmacy choice. Make sure the pharmacy is open so you can pick up prescription now. If there is a problem, you may contact your provider through [employer] and have the  prescription routed to another pharmacy.  Your safety is important to us . If you have drug allergies check your prescription carefully.   For the next 24 hours you can use MyChart to ask questions about today's visit, request a non-urgent call back, or ask for a work or school excuse. You will get an email in the next two days asking about your experience. I hope that your e-visit has been valuable and will speed your recovery.    I have spent 5 minutes in review of e-visit questionnaire, review and updating patient chart, medical decision making and response to patient.   Delon CHRISTELLA Dickinson, PA-C

## 2023-11-21 ENCOUNTER — Encounter: Payer: Self-pay | Admitting: Oncology

## 2023-11-28 ENCOUNTER — Inpatient Hospital Stay: Payer: Self-pay | Attending: Internal Medicine

## 2023-12-03 ENCOUNTER — Ambulatory Visit: Admitting: Oncology

## 2023-12-03 ENCOUNTER — Inpatient Hospital Stay: Payer: Self-pay | Admitting: Oncology

## 2023-12-03 ENCOUNTER — Inpatient Hospital Stay: Payer: Self-pay

## 2023-12-03 ENCOUNTER — Ambulatory Visit

## 2024-02-05 ENCOUNTER — Telehealth: Admitting: Physician Assistant

## 2024-02-05 ENCOUNTER — Encounter: Payer: Self-pay | Admitting: Oncology

## 2024-02-05 DIAGNOSIS — N760 Acute vaginitis: Secondary | ICD-10-CM | POA: Diagnosis not present

## 2024-02-05 DIAGNOSIS — B9689 Other specified bacterial agents as the cause of diseases classified elsewhere: Secondary | ICD-10-CM | POA: Diagnosis not present

## 2024-02-05 MED ORDER — METRONIDAZOLE 500 MG PO TABS: 500.0000 mg | ORAL_TABLET | Freq: Two times a day (BID) | ORAL | 0 refills | Status: AC

## 2024-02-05 NOTE — Progress Notes (Signed)
 Message sent to patient requesting further input regarding current symptoms. Awaiting patient response.

## 2024-02-05 NOTE — Progress Notes (Signed)
